# Patient Record
Sex: Male | Born: 1970 | Race: White | Hispanic: No | Marital: Married | Smoking: Former smoker
Health system: Southern US, Community
[De-identification: ages and names within clinical notes are randomized; demographics above are authoritative.]

## PROBLEM LIST (undated history)

## (undated) DIAGNOSIS — F32A Depression, unspecified: Secondary | ICD-10-CM

## (undated) DIAGNOSIS — T7840XA Allergy, unspecified, initial encounter: Secondary | ICD-10-CM

## (undated) DIAGNOSIS — I10 Essential (primary) hypertension: Secondary | ICD-10-CM

## (undated) DIAGNOSIS — K219 Gastro-esophageal reflux disease without esophagitis: Secondary | ICD-10-CM

## (undated) DIAGNOSIS — E079 Disorder of thyroid, unspecified: Secondary | ICD-10-CM

## (undated) DIAGNOSIS — G4730 Sleep apnea, unspecified: Secondary | ICD-10-CM

## (undated) DIAGNOSIS — A0472 Enterocolitis due to Clostridium difficile, not specified as recurrent: Secondary | ICD-10-CM

## (undated) DIAGNOSIS — F419 Anxiety disorder, unspecified: Secondary | ICD-10-CM

## (undated) DIAGNOSIS — F329 Major depressive disorder, single episode, unspecified: Secondary | ICD-10-CM

## (undated) HISTORY — DX: Enterocolitis due to Clostridium difficile, not specified as recurrent: A04.72

## (undated) HISTORY — DX: Depression, unspecified: F32.A

## (undated) HISTORY — DX: Sleep apnea, unspecified: G47.30

## (undated) HISTORY — DX: Major depressive disorder, single episode, unspecified: F32.9

## (undated) HISTORY — DX: Allergy, unspecified, initial encounter: T78.40XA

## (undated) HISTORY — DX: Anxiety disorder, unspecified: F41.9

## (undated) HISTORY — DX: Gastro-esophageal reflux disease without esophagitis: K21.9

## (undated) HISTORY — PX: COLONOSCOPY: SHX174

## (undated) HISTORY — PX: SIGMOIDOSCOPY: SUR1295

## (undated) HISTORY — PX: WISDOM TOOTH EXTRACTION: SHX21

---

## 2004-10-17 ENCOUNTER — Ambulatory Visit (HOSPITAL_COMMUNITY): Admission: RE | Admit: 2004-10-17 | Discharge: 2004-10-17 | Payer: Self-pay | Admitting: Family Medicine

## 2004-11-19 ENCOUNTER — Ambulatory Visit: Payer: Self-pay | Admitting: Gastroenterology

## 2004-12-03 ENCOUNTER — Encounter (INDEPENDENT_AMBULATORY_CARE_PROVIDER_SITE_OTHER): Payer: Self-pay | Admitting: Specialist

## 2004-12-03 ENCOUNTER — Ambulatory Visit: Payer: Self-pay | Admitting: Gastroenterology

## 2004-12-10 ENCOUNTER — Ambulatory Visit (HOSPITAL_COMMUNITY): Admission: RE | Admit: 2004-12-10 | Discharge: 2004-12-10 | Payer: Self-pay | Admitting: Gastroenterology

## 2004-12-17 ENCOUNTER — Ambulatory Visit: Payer: Self-pay | Admitting: Gastroenterology

## 2006-04-26 ENCOUNTER — Emergency Department (HOSPITAL_COMMUNITY): Admission: EM | Admit: 2006-04-26 | Discharge: 2006-04-26 | Payer: Self-pay | Admitting: Emergency Medicine

## 2006-05-03 ENCOUNTER — Ambulatory Visit (HOSPITAL_COMMUNITY): Admission: RE | Admit: 2006-05-03 | Discharge: 2006-05-03 | Payer: Self-pay | Admitting: Emergency Medicine

## 2006-07-14 ENCOUNTER — Ambulatory Visit: Payer: Self-pay | Admitting: Gastroenterology

## 2007-12-10 ENCOUNTER — Emergency Department (HOSPITAL_COMMUNITY): Admission: EM | Admit: 2007-12-10 | Discharge: 2007-12-11 | Payer: Self-pay | Admitting: Emergency Medicine

## 2008-05-23 ENCOUNTER — Emergency Department (HOSPITAL_COMMUNITY): Admission: EM | Admit: 2008-05-23 | Discharge: 2008-05-23 | Payer: Self-pay | Admitting: Emergency Medicine

## 2008-07-12 ENCOUNTER — Ambulatory Visit (HOSPITAL_COMMUNITY): Admission: RE | Admit: 2008-07-12 | Discharge: 2008-07-12 | Payer: Self-pay | Admitting: Sports Medicine

## 2010-07-27 ENCOUNTER — Encounter: Payer: Self-pay | Admitting: Gastroenterology

## 2010-07-27 ENCOUNTER — Encounter: Payer: Self-pay | Admitting: Family Medicine

## 2010-11-21 NOTE — Assessment & Plan Note (Signed)
[facility] OFFICE NOTE   NAME:CAMPBELLCejay, Hayden Bennett                      MRN:          [id number]  DATE:07/14/2006                            DOB:          02/27/1971    PROBLEM:  Abdominal pain.   Mr. Dunklee has returned once again complaining of abdominal pain. Pain  basically started in the right upper quadrant and radiates to the right  lower quadrant. Bowels are quite irregular, characterized by bloating,  constipation, and loose stools. There is no history of melena or  hematochezia. He apparently was seen in the ER several months ago for  pain. HIDA scan was normal. Blood work including; CBC, lipase, and  urinalysis. CBC and lipase were normal and urinalysis was unremarkable.  Weight has been stable. He is on no medications. Upper endoscopy for  somewhat similar complaints in May 2006 was pertinently only for mild  duodenitis. He claims his stools are varying in color from orange to  green to sometimes clay-colored.   On exam, pulse 60, blood pressure 92/74, weight 145.   PHYSICAL EXAMINATION:  HEENT: EOMI. PERRLA. Sclerae are anicteric.  Conjunctivae are pink.  NECK:  Supple without thyromegaly, adenopathy or carotid bruits.  CHEST:  Clear to auscultation and percussion without adventitious  sounds.  CARDIAC: Regular rhythm; normal S1 S2.  There are no murmurs, gallops or  rubs.  ABDOMEN:  Bowel sounds are normoactive.  Abdomen is soft, nontender and  nondistended.  There are no abdominal masses, tenderness, splenic  enlargement or hepatomegaly.  EXTREMITIES:  Full range of motion.  No cyanosis, clubbing or edema.  RECTAL:  Deferred   IMPRESSION:  Probable irritable bowel syndrome. Inflammatory bowel  disease is less likely.   RECOMMENDATION:  1. NuLev 0.25 mg sublingual every 4 hours as needed.  2. Colonoscopy.     Sandy Salaam. Deatra Ina, MD,FACG  Electronically Signed    RDK/MedQ  DD:  07/14/2006  DT: 07/14/2006  Job #: [id number]

## 2011-04-02 LAB — CBC
HCT: 38 — ABNORMAL LOW
Hemoglobin: 13.4
MCHC: 35.3
Platelets: 156
RDW: 12.2

## 2011-04-02 LAB — URINALYSIS, ROUTINE W REFLEX MICROSCOPIC
Bilirubin Urine: NEGATIVE
Ketones, ur: 40 — AB
Nitrite: NEGATIVE
Protein, ur: NEGATIVE
pH: 6

## 2011-04-02 LAB — POCT I-STAT, CHEM 8
Calcium, Ion: 1.06 — ABNORMAL LOW
HCT: 40
Hemoglobin: 13.6
Sodium: 129 — ABNORMAL LOW
TCO2: 27

## 2011-04-02 LAB — DIFFERENTIAL
Basophils Absolute: 0.1
Basophils Relative: 1
Eosinophils Relative: 0
Monocytes Absolute: 0.3
Neutro Abs: 6.4

## 2011-04-02 LAB — POCT CARDIAC MARKERS
Myoglobin, poc: 55.2
Operator id: 264421

## 2011-04-07 LAB — CBC
HCT: 39.9
Hemoglobin: 13.9
MCHC: 34.8
MCV: 96.6
RDW: 13.1

## 2011-04-07 LAB — URINALYSIS, ROUTINE W REFLEX MICROSCOPIC
Bilirubin Urine: NEGATIVE
Glucose, UA: NEGATIVE
Hgb urine dipstick: NEGATIVE
Ketones, ur: NEGATIVE
pH: 8

## 2011-04-07 LAB — DIFFERENTIAL
Basophils Absolute: 0
Basophils Relative: 1
Lymphocytes Relative: 26
Monocytes Relative: 9
Neutro Abs: 3
Neutrophils Relative %: 64

## 2011-04-07 LAB — COMPREHENSIVE METABOLIC PANEL
Alkaline Phosphatase: 62
BUN: 11
Creatinine, Ser: 0.7
Glucose, Bld: 100 — ABNORMAL HIGH
Potassium: 4
Total Protein: 7

## 2011-11-14 ENCOUNTER — Ambulatory Visit: Payer: Self-pay | Admitting: Physician Assistant

## 2011-11-14 VITALS — BP 144/89 | HR 73 | Temp 97.9°F | Resp 16 | Ht 74.0 in | Wt 210.6 lb

## 2011-11-14 DIAGNOSIS — W57XXXA Bitten or stung by nonvenomous insect and other nonvenomous arthropods, initial encounter: Secondary | ICD-10-CM

## 2011-11-14 DIAGNOSIS — R21 Rash and other nonspecific skin eruption: Secondary | ICD-10-CM

## 2011-11-14 NOTE — Progress Notes (Signed)
  Subjective:    Patient ID: ERIBERTO FELCH, male    DOB: Mar 07, 1971, 41 y.o.   MRN: [id number]  HPI Mr. Prabhakar comes in today c/o ? Insect bite on back of right upper leg.  Noticed a red itchy bump on leg 6 days ago.  Initially itchy with minimal pain.  Those symptoms have since resolved but color has changed and area is slightly larger.  No drainage at any point.  Denies fever or chills, myalgias, arthralgias, HA, other rash.  Pt did not see an insect bite him.  He had RMSF 10 years ago and is concerned today.    Review of Systems As above    Objective:   Physical Exam  Constitutional: He appears well-developed and well-nourished.  Skin:          Right medial posterior thigh with half dollar sized reddish purple lesion with central scab.  No induration or fluctuation.  Superior aspect with several petechial lesions. No streaking or tenderness.    Photo taken of lesion      Assessment & Plan:  Insect bite, right thigh  Reviewed RMSF signs and symptoms.  I do not feel that this is a tick bite but offered Doxycycline.  Pt would like to hold and watch symptoms closely.  Wound care reviewed.

## 2016-10-02 ENCOUNTER — Emergency Department (HOSPITAL_COMMUNITY): Payer: Self-pay

## 2016-10-02 ENCOUNTER — Encounter (HOSPITAL_COMMUNITY): Payer: Self-pay | Admitting: Emergency Medicine

## 2016-10-02 ENCOUNTER — Emergency Department (HOSPITAL_COMMUNITY)
Admission: EM | Admit: 2016-10-02 | Discharge: 2016-10-02 | Disposition: A | Discharge status: Home / Self Care | Payer: Self-pay | Attending: Emergency Medicine | Admitting: Emergency Medicine

## 2016-10-02 DIAGNOSIS — K921 Melena: Secondary | ICD-10-CM

## 2016-10-02 DIAGNOSIS — R195 Other fecal abnormalities: Secondary | ICD-10-CM | POA: Insufficient documentation

## 2016-10-02 DIAGNOSIS — I10 Essential (primary) hypertension: Secondary | ICD-10-CM | POA: Insufficient documentation

## 2016-10-02 DIAGNOSIS — K6389 Other specified diseases of intestine: Secondary | ICD-10-CM | POA: Insufficient documentation

## 2016-10-02 DIAGNOSIS — R197 Diarrhea, unspecified: Secondary | ICD-10-CM | POA: Insufficient documentation

## 2016-10-02 DIAGNOSIS — Z87891 Personal history of nicotine dependence: Secondary | ICD-10-CM | POA: Insufficient documentation

## 2016-10-02 HISTORY — DX: Essential (primary) hypertension: I10

## 2016-10-02 HISTORY — DX: Disorder of thyroid, unspecified: E07.9

## 2016-10-02 LAB — COMPREHENSIVE METABOLIC PANEL
ALBUMIN: 3.9 g/dL (ref 3.5–5.0)
ALK PHOS: 82 U/L (ref 38–126)
ALT: 23 U/L (ref 17–63)
ANION GAP: 9 (ref 5–15)
AST: 22 U/L (ref 15–41)
BUN: 10 mg/dL (ref 6–20)
CO2: 26 mmol/L (ref 22–32)
Calcium: 9 mg/dL (ref 8.9–10.3)
Chloride: 103 mmol/L (ref 101–111)
Creatinine, Ser: 0.93 mg/dL (ref 0.61–1.24)
GFR calc Af Amer: >60 mL/min (ref >60)
GFR calc non Af Amer: >60 mL/min (ref >60)
GLUCOSE: 102 mg/dL — AB (ref 65–99)
POTASSIUM: 3.5 mmol/L (ref 3.5–5.1)
SODIUM: 138 mmol/L (ref 135–145)
Total Bilirubin: 0.8 mg/dL (ref 0.3–1.2)
Total Protein: 8 g/dL (ref 6.5–8.1)

## 2016-10-02 LAB — CBC
HCT: 39.4 % (ref 39.0–52.0)
HEMOGLOBIN: 13.8 g/dL (ref 13.0–17.0)
MCH: 32.3 pg (ref 26.0–34.0)
MCHC: 35 g/dL (ref 30.0–36.0)
MCV: 92.3 fL (ref 78.0–100.0)
Platelets: 368 10*3/uL (ref 150–400)
RBC: 4.27 MIL/uL (ref 4.22–5.81)
RDW: 12.1 % (ref 11.5–15.5)
WBC: 10.6 10*3/uL — AB (ref 4.0–10.5)

## 2016-10-02 MED ORDER — BENZONATATE 100 MG PO CAPS: 100.0000 mg | ORAL_CAPSULE | Freq: Three times a day (TID) | ORAL | 0 refills | Status: DC

## 2016-10-02 MED ORDER — MORPHINE SULFATE (PF) 4 MG/ML IV SOLN: 4.0000 mg | INTRAVENOUS | Status: DC | PRN

## 2016-10-02 MED ORDER — SODIUM CHLORIDE 0.9 % IV SOLN
1000.0000 mL | Freq: Once | INTRAVENOUS | Status: AC
  Administered 2016-10-02: 1000 mL via INTRAVENOUS

## 2016-10-02 MED ORDER — ALBUTEROL SULFATE HFA 108 (90 BASE) MCG/ACT IN AERS
2.0000 | INHALATION_SPRAY | Freq: Once | RESPIRATORY_TRACT | Status: AC
  Administered 2016-10-02: 2 via RESPIRATORY_TRACT
  Filled 2016-10-02: qty 6.7

## 2016-10-02 MED ORDER — IOPAMIDOL (ISOVUE-300) INJECTION 61%
INTRAVENOUS | Status: AC
  Filled 2016-10-02: qty 30

## 2016-10-02 MED ORDER — OXYCODONE-ACETAMINOPHEN 5-325 MG PO TABS: 1.0000 | ORAL_TABLET | ORAL | 0 refills | Status: DC | PRN

## 2016-10-02 MED ORDER — SODIUM CHLORIDE 0.9 % IV SOLN
1000.0000 mL | INTRAVENOUS | Status: DC
  Administered 2016-10-02: 1000 mL via INTRAVENOUS

## 2016-10-02 MED ORDER — IOPAMIDOL (ISOVUE-300) INJECTION 61%
INTRAVENOUS | Status: AC
  Administered 2016-10-02: 100 mL
  Filled 2016-10-02: qty 100

## 2016-10-02 MED ORDER — IOPAMIDOL (ISOVUE-300) INJECTION 61%
30.0000 mL | Freq: Once | INTRAVENOUS | Status: AC | PRN
  Administered 2016-10-02: 30 mL via ORAL

## 2016-10-02 NOTE — ED Provider Notes (Signed)
[facility] DEPT Provider Note   CSN: [id number] Arrival date & time: 10/02/16  0920     History   Chief Complaint Chief Complaint  Patient presents with  . Rectal Pain    HPI Hayden Bennett is a 46 y.o. male.  HPI Patient presents emergency department with 2 months of diarrhea and now several weeks of blood noted in his stool with painful defecation.  He's also had URI symptoms over the past 2 months with ongoing productive cough.  No chest pain or shortness of breath.  He has never had a colonoscopy.  No family history of early colon cancer.  Sometimes he will have blood in the toilet or blood on the tissue.  No prior history of hemorrhoids.  No fevers or chills.  No history of tobacco abuse.   Past Medical History:  Diagnosis Date  . Hypertension   . Thyroid disease     There are no active problems to display for this patient.   Past Surgical History:  Procedure Laterality Date  . WISDOM TOOTH EXTRACTION         Home Medications    Prior to Admission medications   Medication Sig Start Date End Date Taking? Authorizing Provider  losartan-hydrochlorothiazide (HYZAAR) 100-12.5 MG tablet Take 1 tablet by mouth daily.   Yes Historical Provider, MD  NP THYROID 90 MG tablet Take 90 mg by mouth daily. 07/13/16  Yes Historical Provider, MD  oxyCODONE-acetaminophen (PERCOCET/ROXICET) 5-325 MG tablet Take 1 tablet by mouth every 4 (four) hours as needed for severe pain. 10/02/16   Jola Schmidt, MD    Family History No family history on file.  Social History Social History  Substance Use Topics  . Smoking status: Former Smoker    Quit date: 11/13/1997  . Smokeless tobacco: Never Used  . Alcohol use No     Comment: none since Jan 2018     Allergies   Ciprofloxacin   Review of Systems Review of Systems  All other systems reviewed and are negative.    Physical Exam Updated Vital Signs BP 112/69   Pulse 91   Temp 97.8 F (36.6 C) (Oral)   Resp 14    SpO2 94%   Physical Exam  Constitutional: He is oriented to person, place, and time. He appears well-developed and well-nourished.  HENT:  Head: Normocephalic and atraumatic.  Eyes: EOM are normal.  Neck: Normal range of motion.  Cardiovascular: Normal rate, regular rhythm, normal heart sounds and intact distal pulses.   Pulmonary/Chest: Effort normal and breath sounds normal. No respiratory distress.  Abdominal: Soft. He exhibits no distension. There is no tenderness.  Genitourinary:  Genitourinary Comments: Pain elicited with rectal examination.  No gross blood.  No obvious masses or hemorrhoids palpable.  No external hemorrhoids visible.  Musculoskeletal: Normal range of motion.  Neurological: He is alert and oriented to person, place, and time.  Skin: Skin is warm and dry.  Psychiatric: He has a normal mood and affect. Judgment normal.  Nursing note and vitals reviewed.    ED Treatments / Results  Labs (all labs ordered are listed, but only abnormal results are displayed) Labs Reviewed  CBC - Abnormal; Notable for the following:       Result Value   WBC 10.6 (*)    All other components within normal limits  COMPREHENSIVE METABOLIC PANEL - Abnormal; Notable for the following:    Glucose, Bld 102 (*)    All other components within normal limits  EKG  EKG Interpretation None       Radiology Dg Chest 2 View  Result Date: 10/02/2016 CLINICAL DATA:  Cough. EXAM: CHEST  2 VIEW COMPARISON:  Radiographs of December 10, 2007. FINDINGS: The heart size and mediastinal contours are within normal limits. Both lungs are clear. No pneumothorax or pleural effusion is noted. The visualized skeletal structures are unremarkable. IMPRESSION: No active cardiopulmonary disease. Electronically Signed   By: Marijo Conception, M.D.   On: 10/02/2016 10:42   Ct Abdomen Pelvis W Contrast  Result Date: 10/02/2016 CLINICAL DATA:  Rectal pain for 2 weeks. EXAM: CT ABDOMEN AND PELVIS WITH CONTRAST  TECHNIQUE: Multidetector CT imaging of the abdomen and pelvis was performed using the standard protocol following bolus administration of intravenous contrast. CONTRAST:  160m ISOVUE-300 IOPAMIDOL (ISOVUE-300) INJECTION 61%, 337mISOVUE-300 IOPAMIDOL (ISOVUE-300) INJECTION 61% COMPARISON:  CT scan of April 02, 2008. FINDINGS: Lower chest: No acute abnormality. Hepatobiliary: No focal liver abnormality is seen. No gallstones, gallbladder wall thickening, or biliary dilatation. Pancreas: Unremarkable. No pancreatic ductal dilatation or surrounding inflammatory changes. Spleen: Normal in size without focal abnormality. Adrenals/Urinary Tract: Adrenal glands are unremarkable. Kidneys are normal, without renal calculi, focal lesion, or hydronephrosis. Bladder is unremarkable. Stomach/Bowel: No definite bowel obstruction is noted. The appendix appears normal. However, there is the suggestion of a lobulated density seen within the right colon concerning for possible mass. Vascular/Lymphatic: No significant vascular findings are present. No enlarged abdominal or pelvic lymph nodes. Reproductive: Prostate is unremarkable. Other: No abdominal wall hernia or abnormality. No abdominopelvic ascites. Musculoskeletal: No acute or significant osseous findings. IMPRESSION: Lobulated density seen within the right colon concerning for possible mass or neoplasm. Colonoscopy is recommended for further evaluation. No other abnormality seen in the abdomen or pelvis. Electronically Signed   By: JaMarijo ConceptionM.D.   On: 10/02/2016 12:02    Procedures Procedures (including critical care time)  Medications Ordered in ED Medications  0.9 %  sodium chloride infusion (0 mLs Intravenous Stopped 10/02/16 1153)    Followed by  0.9 %  sodium chloride infusion (1,000 mLs Intravenous New Bag/Given 10/02/16 1017)  morphine 4 MG/ML injection 4 mg (not administered)  iopamidol (ISOVUE-300) 61 % injection (not administered)  albuterol  (PROVENTIL HFA;VENTOLIN HFA) 108 (90 Base) MCG/ACT inhaler 2 puff (2 puffs Inhalation Given 10/02/16 0958)  iopamidol (ISOVUE-300) 61 % injection 30 mL (30 mLs Oral Contrast Given 10/02/16 1010)  iopamidol (ISOVUE-300) 61 % injection (100 mLs  Contrast Given 10/02/16 1127)     Initial Impression / Assessment and Plan / ED Course  I have reviewed the triage vital signs and the nursing notes.  Pertinent labs & imaging results that were available during my care of the patient were reviewed by me and considered in my medical decision making (see chart for details).     CT scan concerning for right-sided colonic mass which will need further evaluation by direct visualization and colonoscopy.  In regards to his pain with defecation no clear etiology is found.  Suspect possible internal hemorrhoids.  Patient be treated with a short course pain medication.  He's been given referral numbers for GI.  He understands that this could represent colon cancer.  He understands importance of close follow-up, direct visualization with colonoscopy and possible need for biopsy.  He understands to return the emergency department for new or worsening symptoms  [facility].  No shortness of  breath.  Home with bronchodilators and Tessalon Perles  Final Clinical Impressions(s) / ED Diagnoses   Final diagnoses:  Diarrhea, unspecified type  Blood in stool  Colonic mass    New Prescriptions New Prescriptions   OXYCODONE-ACETAMINOPHEN (PERCOCET/ROXICET) 5-325 MG TABLET    Take 1 tablet by mouth every 4 (four) hours as needed for severe pain.     Jola Schmidt, MD 10/02/16 [phone]

## 2016-10-02 NOTE — ED Notes (Signed)
Patient transported to CT 

## 2016-10-02 NOTE — ED Notes (Signed)
ED Provider at bedside. 

## 2016-10-02 NOTE — ED Triage Notes (Signed)
Patient c/o rectal pain and bloody stools that have been going on for couple weeks. Patient reports URI and diarrhea since Jan this year.  Patient states has rectal pain all ways then pain increases when having BM.  Patient states that blood in stools is intermittent and sometimes it is bright red, sometimes patient states that water in toilet is red, sometimes blood will just be on paper when wiping and other times will have blood that will drip from rectum after BM.

## 2016-10-05 ENCOUNTER — Telehealth: Payer: Self-pay | Admitting: Internal Medicine

## 2016-10-05 NOTE — Telephone Encounter (Signed)
Looks ok to wait until next week to be seen in my opinion If he worsens he should call us back  Sitz baths may help rectal pain

## 2016-10-05 NOTE — Telephone Encounter (Signed)
Patient has been scheduled for a office visit with APP for next week.  Ok to wait? See CT report from ED.  Former Financial planner patient

## 2016-10-05 NOTE — Telephone Encounter (Signed)
Sent to attention of DOD Dr.Gessner. Please advise as to scheduling.

## 2016-10-05 NOTE — Telephone Encounter (Signed)
Patient notified that we will call if there are any openings earlier.

## 2016-10-06 ENCOUNTER — Ambulatory Visit (INDEPENDENT_AMBULATORY_CARE_PROVIDER_SITE_OTHER): Payer: Self-pay | Admitting: Gastroenterology

## 2016-10-06 ENCOUNTER — Encounter: Payer: Self-pay | Admitting: Gastroenterology

## 2016-10-06 ENCOUNTER — Other Ambulatory Visit (INDEPENDENT_AMBULATORY_CARE_PROVIDER_SITE_OTHER): Payer: Self-pay

## 2016-10-06 VITALS — BP 96/56 | HR 92 | Resp 18 | Ht 74.0 in | Wt 198.0 lb

## 2016-10-06 DIAGNOSIS — R197 Diarrhea, unspecified: Secondary | ICD-10-CM

## 2016-10-06 DIAGNOSIS — K6289 Other specified diseases of anus and rectum: Secondary | ICD-10-CM

## 2016-10-06 DIAGNOSIS — R938 Abnormal findings on diagnostic imaging of other specified body structures: Secondary | ICD-10-CM

## 2016-10-06 DIAGNOSIS — R9389 Abnormal findings on diagnostic imaging of other specified body structures: Secondary | ICD-10-CM

## 2016-10-06 LAB — CBC WITH DIFFERENTIAL/PLATELET
BASOS ABS: 0.1 10*3/uL (ref 0.0–0.1)
Basophils Relative: 0.7 % (ref 0.0–3.0)
EOS ABS: 0.1 10*3/uL (ref 0.0–0.7)
Eosinophils Relative: 0.5 % (ref 0.0–5.0)
HCT: 38.9 % — ABNORMAL LOW (ref 39.0–52.0)
HEMOGLOBIN: 13.5 g/dL (ref 13.0–17.0)
Lymphocytes Relative: 6.8 % — ABNORMAL LOW (ref 12.0–46.0)
Lymphs Abs: 1 10*3/uL (ref 0.7–4.0)
MCHC: 34.7 g/dL (ref 30.0–36.0)
MCV: 91.2 fl (ref 78.0–100.0)
MONO ABS: 1.1 10*3/uL — AB (ref 0.1–1.0)
Monocytes Relative: 7.7 % (ref 3.0–12.0)
Neutro Abs: 12.1 10*3/uL — ABNORMAL HIGH (ref 1.4–7.7)
Neutrophils Relative %: 84.3 % — ABNORMAL HIGH (ref 43.0–77.0)
Platelets: 452 10*3/uL — ABNORMAL HIGH (ref 150.0–400.0)
RBC: 4.27 Mil/uL (ref 4.22–5.81)
RDW: 12.2 % (ref 11.5–15.5)
WBC: 14.4 10*3/uL — AB (ref 4.0–10.5)

## 2016-10-06 MED ORDER — NA SULFATE-K SULFATE-MG SULF 17.5-3.13-1.6 GM/177ML PO SOLN: 1.0000 | Freq: Once | ORAL | 0 refills | Status: AC

## 2016-10-06 MED ORDER — AMOXICILLIN-POT CLAVULANATE 875-125 MG PO TABS: 1.0000 | ORAL_TABLET | Freq: Two times a day (BID) | ORAL | 0 refills | Status: DC

## 2016-10-06 MED ORDER — OXYCODONE-ACETAMINOPHEN 5-325 MG PO TABS: 1.0000 | ORAL_TABLET | Freq: Four times a day (QID) | ORAL | 0 refills | Status: DC | PRN

## 2016-10-06 NOTE — Progress Notes (Signed)
HPI :  46 y/o male with a history of HTN and hypothyroidism, presenting for a new patient visit for rectal discomfort, changes in bowel habits, and abnormal CT scan.   He has severe pain in his anal canal / rectum. He reports pain is worst with a bowel movement and stays painful for an hour after to a severe degree before decreasing in severity but does have some baseline discomfort all the time since it's started. He's also had diarrhea starting in January, and then rectal bleeding which started about 3 weeks ago. Pain ongoing for a few weeks but persistently about 2 weeks at this point. He was in the ER at [facility] last Friday. He had a CT of the abdomen / pelvis done which showed a ? mass in the right colon, otherwise no pathology in the pelvis noted. He feels like something is "stuck" in his rectum causing pain. He has a sense of incomplete evacuation.  He was given some oxycodone for the pain by the ER, which helps with some of the discomfort. He does endorse a history of anal fissure in the past. He has lost about 10 lbs in the past few weeks. He is having about 2 bowel movements per day, trying to stay on bland diet to minimize bowel movements. He has tried [employer].   No FH of CRC or Crohn's. He denies NSAID use.    Past Medical History:  Diagnosis Date  . Hypertension   . Thyroid disease      Past Surgical History:  Procedure Laterality Date  . WISDOM TOOTH EXTRACTION     Family History  Problem Relation Age of Onset  . Hypertension Mother   . Thyroid disease Mother   . Hypertension Father    Social History  Substance Use Topics  . Smoking status: Former Smoker    Quit date: 11/13/1997  . Smokeless tobacco: Never Used  . Alcohol use No     Comment: none since Jan 2018   Current Outpatient Prescriptions  Medication Sig Dispense Refill  . benzonatate (TESSALON) 100 MG capsule Take 1 capsule (100 mg total) by mouth every 8 (eight) hours. 21 capsule 0  .  losartan-hydrochlorothiazide (HYZAAR) 100-12.5 MG tablet Take 1 tablet by mouth daily.    . NP THYROID 90 MG tablet Take 90 mg by mouth daily.  2   No current facility-administered medications for this visit.    Allergies  Allergen Reactions  . Ciprofloxacin Swelling    Swelling of lower leg muscles     Review of Systems: All systems reviewed and negative except where noted in HPI.    Dg Chest 2 View  Result Date: 10/02/2016 CLINICAL DATA:  Cough. EXAM: CHEST  2 VIEW COMPARISON:  Radiographs of December 10, 2007. FINDINGS: The heart size and mediastinal contours are within normal limits. Both lungs are clear. No pneumothorax or pleural effusion is noted. The visualized skeletal structures are unremarkable. IMPRESSION: No active cardiopulmonary disease. Electronically Signed   By: Marijo Conception, M.D.   On: 10/02/2016 10:42   Ct Abdomen Pelvis W Contrast  Result Date: 10/02/2016 CLINICAL DATA:  Rectal pain for 2 weeks. EXAM: CT ABDOMEN AND PELVIS WITH CONTRAST TECHNIQUE: Multidetector CT imaging of the abdomen and pelvis was performed using the standard protocol following bolus administration of intravenous contrast. CONTRAST:  112m ISOVUE-300 IOPAMIDOL (ISOVUE-300) INJECTION 61%, 369mISOVUE-300 IOPAMIDOL (ISOVUE-300) INJECTION 61% COMPARISON:  CT scan of April 02, 2008. FINDINGS: Lower chest: No acute abnormality.  Hepatobiliary: No focal liver abnormality is seen. No gallstones, gallbladder wall thickening, or biliary dilatation. Pancreas: Unremarkable. No pancreatic ductal dilatation or surrounding inflammatory changes. Spleen: Normal in size without focal abnormality. Adrenals/Urinary Tract: Adrenal glands are unremarkable. Kidneys are normal, without renal calculi, focal lesion, or hydronephrosis. Bladder is unremarkable. Stomach/Bowel: No definite bowel obstruction is noted. The appendix appears normal. However, there is the suggestion of a lobulated density seen within the right colon  concerning for possible mass. Vascular/Lymphatic: No significant vascular findings are present. No enlarged abdominal or pelvic lymph nodes. Reproductive: Prostate is unremarkable. Other: No abdominal wall hernia or abnormality. No abdominopelvic ascites. Musculoskeletal: No acute or significant osseous findings. IMPRESSION: Lobulated density seen within the right colon concerning for possible mass or neoplasm. Colonoscopy is recommended for further evaluation. No other abnormality seen in the abdomen or pelvis. Electronically Signed   By: Marijo Conception, M.D.   On: 10/02/2016 12:02    Lab Results  Component Value Date   WBC 10.6 (H) 10/02/2016   HGB 13.8 10/02/2016   HCT 39.4 10/02/2016   MCV 92.3 10/02/2016   PLT 368 10/02/2016     Physical Exam: BP (!) 96/56   Pulse 92   Resp 18   Ht 6' 2"  (1.88 m)   Wt 198 lb (89.8 kg)   BMI 25.42 kg/m  Constitutional: Pleasant, male HEENT: Normocephalic and atraumatic. Conjunctivae are normal. No scleral icterus. Neck supple.  Cardiovascular: Normal rate, regular rhythm.  Pulmonary/chest: Effort normal and breath sounds normal. No wheezing, rales or rhonchi. Abdominal: Soft, nondistended, nontender.There are no masses palpable. No hepatomegaly DRE / Anoscopy - some mild diffuse tenderness around perianal area but no focal palpable fluctuance or erythema, no fistula, no fissure appreciated. Pain with placement of examination finger and anoscopy. No mass. Anoscopy shows inflamed internal hemorrhoids with some purulent discharge at right posterior hemorrhoid, but could not visualize origin  Extremities: no edema Lymphadenopathy: No cervical adenopathy noted. Neurological: Alert and oriented to person place and time. Skin: Skin is warm and dry. No rashes noted. Psychiatric: Normal mood and affect. Behavior is normal.   ASSESSMENT AND PLAN: 46 year old male with medical history as outlined above presenting for the following issues:  Rectal pain  - previously had CT scan abdomen / pelvis done 4 days ago for this symptom which has persisted. I called Dr. Nyoka Cowden of radiology and reviewed the scan with him, specifically evaluating for any evidence of perirectal abscess, which he states he sees no evidence of. DRE today shows no fissure, fistula, or overt abscess on external exam, but given pain and purulent material noted on anoscopy along inflamed hemorrhoids, an abscess remains possible. I'm going to repeat a CBC today and start him empirically on antibiotics, we'll give him Augmentin 833m BID given his Cipro allergy. I will refill a short course of Percocet to take as needed due to severe pain. If he is not improving in the next 24-48 hours, or worsening in the interim, I instructed him to call me and we will repeat dedicated imaging of the pelvis with either CT or MRI to ensure no evidence of abscess. Otherwise I have tentatively scheduled for colonoscopy next week, rule out proctitis, I don't think he can tolerate prep at this time, hopefully able to once pain improved. If not will perform flex sig  Abnormal CT scan - there is concern for a possible right colon mass on CT. He warrants a colonoscopy to further evaluate discussed this with him. Following  discussion of risks and benefits he wanted to proceed. Hopefully his rectal discomfort is improved with therapy as above and he can tolerate a bowel prep next week. Further recommendations pending the result  Diarrhea - mild at this time, no evidence of colitis noted on CT. We'll send stool for infectious workup given his symptoms but otherwise await colonoscopy next week. Counseled him his diarrhea may worsen on Augmentin   Cellar, MD [facility] Pager [phone]

## 2016-10-06 NOTE — Patient Instructions (Addendum)
If you are age 47 or older, your body mass index should be between 23-30. Your Body mass index is 25.42 kg/m. If this is out of the aforementioned range listed, please consider follow up with your Primary Care Provider.  If you are age 72 or younger, your body mass index should be between 19-25. Your Body mass index is 25.42 kg/m. If this is out of the aformentioned range listed, please consider follow up with your Primary Care Provider.   Your physician has requested that you go to the basement for the following lab work before leaving today:  CBC, GI Pathogen panel  We have sent the following medications to your pharmacy for you to pick up at your convenience:  Percocet  Augmentin  Suprep  You have been scheduled for a colonoscopy. Please follow written instructions given to you at your visit today.  Please pick up your prep supplies at the pharmacy within the next 1-3 days. If you use inhalers (even only as needed), please bring them with you on the day of your procedure. Your physician has requested that you go to www.startemmi.com and enter the access code given to you at your visit today. This web site gives a general overview about your procedure. However, you should still follow specific instructions given to you by our office regarding your preparation for the procedure.  Please call the office back if you are not better or symptoms become worse.  Thank you.

## 2016-10-07 ENCOUNTER — Other Ambulatory Visit: Payer: Self-pay

## 2016-10-07 ENCOUNTER — Ambulatory Visit (HOSPITAL_COMMUNITY)
Admission: RE | Admit: 2016-10-07 | Discharge: 2016-10-07 | Disposition: A | Discharge status: Home / Self Care | Payer: Self-pay | Source: Ambulatory Visit | Attending: Gastroenterology | Admitting: Gastroenterology

## 2016-10-07 DIAGNOSIS — D72829 Elevated white blood cell count, unspecified: Secondary | ICD-10-CM

## 2016-10-07 DIAGNOSIS — K6289 Other specified diseases of anus and rectum: Secondary | ICD-10-CM

## 2016-10-07 MED ORDER — IOPAMIDOL (ISOVUE-300) INJECTION 61%: 75.0000 mL | Freq: Once | INTRAVENOUS | Status: DC | PRN

## 2016-10-08 ENCOUNTER — Other Ambulatory Visit: Payer: Self-pay

## 2016-10-08 DIAGNOSIS — R9389 Abnormal findings on diagnostic imaging of other specified body structures: Secondary | ICD-10-CM

## 2016-10-08 DIAGNOSIS — R197 Diarrhea, unspecified: Secondary | ICD-10-CM

## 2016-10-08 DIAGNOSIS — K6289 Other specified diseases of anus and rectum: Secondary | ICD-10-CM

## 2016-10-08 MED ORDER — LIDOCAINE HCL 2 % EX GEL: 1.0000 "application " | CUTANEOUS | 1 refills | Status: DC | PRN

## 2016-10-09 ENCOUNTER — Telehealth: Payer: Self-pay | Admitting: Gastroenterology

## 2016-10-09 LAB — GASTROINTESTINAL PATHOGEN PANEL PCR
C. difficile Tox A/B, PCR: DETECTED — CR
Campylobacter, PCR: NOT DETECTED
Cryptosporidium, PCR: NOT DETECTED
E COLI (ETEC) LT/ST, PCR: NOT DETECTED
E COLI (STEC) STX1/STX2, PCR: NOT DETECTED
E COLI 0157, PCR: NOT DETECTED
Giardia lamblia, PCR: NOT DETECTED
NOROVIRUS, PCR: NOT DETECTED
Rotavirus A, PCR: NOT DETECTED
SALMONELLA, PCR: NOT DETECTED
SHIGELLA, PCR: NOT DETECTED

## 2016-10-09 MED ORDER — METRONIDAZOLE 500 MG PO TABS: 500.0000 mg | ORAL_TABLET | Freq: Three times a day (TID) | ORAL | 0 refills | Status: AC

## 2016-10-09 NOTE — Telephone Encounter (Signed)
Patient called back Flagyl was much cheaper Sent Rx for 553m TID X14 days

## 2016-10-09 NOTE — Telephone Encounter (Signed)
Oncall Note Called patient informed Positive C.diff. He wanted to check the pharmacy that offered the cheapest price for Vancomycin or Flagyl and then decide as he currently has no insurnace. Advised him to call back once he has the information for the pharmacy that I need to send the Rx. Also advised him to stop Augmentin K. Denzil Magnuson , MD [phone] Mon-Fri 8a-5p [phone] after 5p, weekends, holidays

## 2016-10-12 ENCOUNTER — Other Ambulatory Visit: Payer: Self-pay

## 2016-10-13 ENCOUNTER — Ambulatory Visit: Payer: Self-pay | Admitting: Physician Assistant

## 2016-10-13 ENCOUNTER — Telehealth: Payer: Self-pay

## 2016-10-13 ENCOUNTER — Other Ambulatory Visit: Payer: Self-pay

## 2016-10-13 DIAGNOSIS — K6289 Other specified diseases of anus and rectum: Secondary | ICD-10-CM

## 2016-10-13 NOTE — Telephone Encounter (Signed)
Patient advised and scheduled for 4/24 at 10:00 [place] for colonoscopy. Mailed prep instructions. Patient did not start his antibiotics until 4/8, will finish them on 4/22. Asked that he call office by Friday, 4/20 if stools not back to normal. Patient states that he is having no more rectal pain, stools are now formed but soft.

## 2016-10-13 NOTE — Telephone Encounter (Signed)
Okay thanks [employer]

## 2016-10-15 ENCOUNTER — Encounter: Payer: Self-pay | Admitting: Gastroenterology

## 2016-10-27 ENCOUNTER — Ambulatory Visit (AMBULATORY_SURGERY_CENTER): Payer: Self-pay | Admitting: Gastroenterology

## 2016-10-27 ENCOUNTER — Encounter: Payer: Self-pay | Admitting: Gastroenterology

## 2016-10-27 VITALS — BP 119/87 | HR 79 | Temp 98.6°F | Resp 13 | Ht 74.0 in | Wt 198.0 lb

## 2016-10-27 DIAGNOSIS — K6289 Other specified diseases of anus and rectum: Secondary | ICD-10-CM

## 2016-10-27 DIAGNOSIS — K633 Ulcer of intestine: Secondary | ICD-10-CM

## 2016-10-27 DIAGNOSIS — R935 Abnormal findings on diagnostic imaging of other abdominal regions, including retroperitoneum: Secondary | ICD-10-CM

## 2016-10-27 MED ORDER — SODIUM CHLORIDE 0.9 % IV SOLN: 500.0000 mL | INTRAVENOUS | Status: DC

## 2016-10-27 NOTE — Progress Notes (Signed)
Called to room to assist during endoscopic procedure.  Patient ID and intended procedure confirmed with present staff. Received instructions for my participation in the procedure from the performing physician.  

## 2016-10-27 NOTE — Progress Notes (Signed)
To Pacu  Pt awae and alert report to RN

## 2016-10-27 NOTE — Patient Instructions (Signed)

## 2016-10-27 NOTE — Op Note (Addendum)
[place] Patient Name: Hayden Bennett Procedure Date: 10/27/2016 10:13 AM MRN: [id number] Endoscopist: Remo Lipps P. Neel Buffone MD, MD Age: 46 Referring MD:  Date of Birth: July 17, 1970 Gender: Male Account #: [id number] Procedure:                Colonoscopy Indications:              Abnormal CT of the GI tract (reported right colon                            mass), diarrhea, Rectal pain - patient tested                            positive for C diff and symptoms significantly                            improved / resolved with flagyl Medicines:                Monitored Anesthesia Care Procedure:                Pre-Anesthesia Assessment:                           - Prior to the procedure, a History and Physical                            was performed, and patient medications and                            allergies were reviewed. The patient's tolerance of                            previous anesthesia was also reviewed. The risks                            and benefits of the procedure and the sedation                            options and risks were discussed with the patient.                            All questions were answered, and informed consent                            was obtained. Prior Anticoagulants: The patient has                            taken no previous anticoagulant or antiplatelet                            agents. ASA Grade Assessment: II - A patient with                            mild systemic disease. After reviewing the risks  and benefits, the patient was deemed in                            satisfactory condition to undergo the procedure.                           After obtaining informed consent, the colonoscope                            was passed under direct vision. Throughout the                            procedure, the patient's blood pressure, pulse, and                            oxygen saturations were  monitored continuously. The                            Colonoscope was introduced through the anus and                            advanced to the the terminal ileum, with                            identification of the appendiceal orifice and IC                            valve. The colonoscopy was performed without                            difficulty. The patient tolerated the procedure                            well. The quality of the bowel preparation was                            good. The terminal ileum, ileocecal valve,                            appendiceal orifice, and rectum were photographed. Scope In: 10:18:41 AM Scope Out: 10:36:23 AM Total Procedure Duration: 0 hours 17 minutes 42 seconds  Findings:                 The perianal exam findings include very small anal                            fissure.                           The terminal ileum appeared normal.                           Multiple deep and medium to large sized ulcers were  found in the entire colon, worst in the hepatic                            flexure and right colon. No bleeding was present.                            Biopsies were taken with a cold forceps for                            histology.                           Internal hemorrhoids were found during                            retroflexion. A small ulcer was noted just proximal                            to the dentate line in the rectum which I suspect                            was causing the patient's rectal pain.                           The exam was otherwise without abnormality. No                            other inflammatory changes appreciated outside of                            the ulcerations, the colonic mucosa appeared                            normal. No mass lesion in the right colon                            appreciated on normal and retroflexed views. Complications:            No immediate  complications. Estimated blood loss:                            Minimal. Estimated Blood Loss:     Estimated blood loss was minimal. Impression:               - Small anal fissure found on perianal exam.                           - The examined portion of the ileum was normal.                           - Multiple ulcers in the entire examined colon.                            Biopsied.                           -  Internal hemorrhoids.                           - Small rectal ulcer, suspect causing the patient's                            prior symptoms.                           - The examination was otherwise normal.                           Overall, differential includes Crohn's disease                            versus infectious colitis versus other, will await                            pathology results.                           No colon mass appreciated, I suspect inflammatory                            changes appreciated on this exam caused the                            appearance of a mass on imaging. Recommendation:           - Patient has a contact number available for                            emergencies. The signs and symptoms of potential                            delayed complications were discussed with the                            patient. Return to normal activities tomorrow.                            Written discharge instructions were provided to the                            patient.                           - Resume previous diet.                           - Continue present medications.                           - No aspirin, ibuprofen, naproxen, or other                            non-steroidal anti-inflammatory drugs.                           -  Await pathology results.                           - Continue lidocaine jelly as needed for rectal                            pain if symptoms persist [name] P. Suede Greenawalt MD, MD 10/27/2016 10:45:41 AM This  report has been signed electronically.

## 2016-10-27 NOTE — Progress Notes (Signed)
Pt was dx c diff 10-09-16.  Amoxicillin d/c.  Began flagyl 500 mg tid for 2 weeks.  Completed flagyl 10-24-16.  Pt states my stools are firming up now. maw

## 2016-10-28 ENCOUNTER — Telehealth: Payer: Self-pay

## 2016-10-28 NOTE — Telephone Encounter (Signed)
  Follow up Call-  Call back number 10/27/2016  Post procedure Call Back phone  # [phone] cell  Permission to leave phone message Yes  Some recent data might be hidden     Patient questions:  Do you have a fever, pain , or abdominal swelling? No. Pain Score  0 *  Have you tolerated food without any problems? Yes.    Have you been able to return to your normal activities? Yes.    Do you have any questions about your discharge instructions: Diet   No. Medications  No. Follow up visit  No.  Do you have questions or concerns about your Care? No.  Actions: * If pain score is 4 or above: No action needed, pain <4.

## 2016-11-02 ENCOUNTER — Other Ambulatory Visit: Payer: Self-pay

## 2016-11-02 ENCOUNTER — Telehealth: Payer: Self-pay | Admitting: Gastroenterology

## 2016-11-02 DIAGNOSIS — A0472 Enterocolitis due to Clostridium difficile, not specified as recurrent: Secondary | ICD-10-CM

## 2016-11-02 NOTE — Telephone Encounter (Signed)
Likely to be a recurrence, but we should know for sure.  If positive, will need vancomycin.  Today or tomorrow, needs to go to lab to arrange specimen for C diff antigen, toxin A/B and PCR testing.

## 2016-11-02 NOTE — Telephone Encounter (Signed)
Spoke with the patient and he agrees to this plan of care.

## 2016-11-02 NOTE — Telephone Encounter (Signed)
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 He was treated with Flagyl. Completed his course about 10 days ago per patient. He  had a colonoscopy on 10/27/16. He calls today with complaints of rectal pain returning and having at least 3 loose stools daily. He had one day of body aches "flu like" but that has resolved. His concern is a return of the c-diff infection. States "these are the symptoms I had with the last infection."

## 2016-11-03 ENCOUNTER — Other Ambulatory Visit: Payer: Self-pay | Admitting: Gastroenterology

## 2016-11-03 ENCOUNTER — Other Ambulatory Visit: Payer: Self-pay

## 2016-11-03 DIAGNOSIS — R197 Diarrhea, unspecified: Secondary | ICD-10-CM

## 2016-11-03 DIAGNOSIS — A0472 Enterocolitis due to Clostridium difficile, not specified as recurrent: Secondary | ICD-10-CM

## 2016-11-03 MED ORDER — METRONIDAZOLE 500 MG PO TABS: 500.0000 mg | ORAL_TABLET | Freq: Three times a day (TID) | ORAL | 0 refills | Status: AC

## 2016-11-04 ENCOUNTER — Telehealth: Payer: Self-pay | Admitting: Gastroenterology

## 2016-11-04 ENCOUNTER — Other Ambulatory Visit: Payer: Self-pay

## 2016-11-04 LAB — CLOSTRIDIUM DIFFICILE BY PCR: Toxigenic C. Difficile by PCR: DETECTED — CR

## 2016-11-04 LAB — C. DIFFICILE GDH AND TOXIN A/B
C. DIFFICILE GDH: DETECTED — AB
C. difficile Toxin A/B: DETECTED — AB

## 2016-11-04 MED ORDER — VANCOMYCIN HCL 125 MG PO CAPS: 125.0000 mg | ORAL_CAPSULE | Freq: Four times a day (QID) | ORAL | 0 refills | Status: AC

## 2016-11-04 NOTE — Telephone Encounter (Signed)
Called patient and informed positive C.diff. Sent Rx for PO Vancomycin four times daily X14 days K. Denzil Magnuson , MD [phone] Mon-Fri 8a-5p [phone] after 5p, weekends, holidays

## 2016-11-04 NOTE — Telephone Encounter (Signed)
[place] to see if they will compound to liquid form to help with cost. Per pharmacist they do not compound the med because it is commercially available. They have the liquid form at the site  Pharmacist states that without insurance the liquid form would be $292 based off sig from capsule form Dr. Loletha Carrow prescribed. I informed pt of this and he will contact [facility] before picking up because he has a coupon for the capsules and wants to make sure which form will be more beneficial to him. He states that he does not have insurance.

## 2016-11-23 ENCOUNTER — Other Ambulatory Visit: Payer: Self-pay

## 2016-11-23 ENCOUNTER — Telehealth: Payer: Self-pay | Admitting: Gastroenterology

## 2016-11-23 DIAGNOSIS — R350 Frequency of micturition: Secondary | ICD-10-CM

## 2016-11-23 NOTE — Telephone Encounter (Signed)
Okay, we should obtain UA with urine culture at this time to ensure no UTI. We will let him know the results. Glad to hear the diarrhea has improved. Thanks

## 2016-11-23 NOTE — Telephone Encounter (Signed)
Since taking the vancomycin patient has been experiencing frequent urination. He denies any pain or discoloration upon urinating. He had finished the flagyl before starting on the vancomycin as it took the pharmacy along time to get the medication to him. Started vancomycin 6 days ago, has 8 more days of medication. He states that he no longer is having diarrhea, but is concerned about the frequent urination, every 2-3 hours around the clock. Please advise.

## 2016-11-23 NOTE — Telephone Encounter (Signed)
Patient will come in tomorrow to do UA and will also get a urine culture. Told him that as soon as we had results we would let him know.

## 2016-11-24 ENCOUNTER — Other Ambulatory Visit (INDEPENDENT_AMBULATORY_CARE_PROVIDER_SITE_OTHER): Payer: Self-pay

## 2016-11-24 DIAGNOSIS — R350 Frequency of micturition: Secondary | ICD-10-CM

## 2016-11-24 LAB — URINALYSIS
Bilirubin Urine: NEGATIVE
HGB URINE DIPSTICK: NEGATIVE
Ketones, ur: NEGATIVE
Leukocytes, UA: NEGATIVE
Nitrite: NEGATIVE
PH: 6 (ref 5.0–8.0)
SPECIFIC GRAVITY, URINE: 1.01 (ref 1.000–1.030)
TOTAL PROTEIN, URINE-UPE24: NEGATIVE
URINE GLUCOSE: NEGATIVE
Urobilinogen, UA: 0.2 (ref 0.0–1.0)

## 2016-11-25 LAB — URINE CULTURE: ORGANISM ID, BACTERIA: NO GROWTH

## 2016-12-08 ENCOUNTER — Encounter: Payer: Self-pay | Admitting: Gastroenterology

## 2016-12-08 ENCOUNTER — Ambulatory Visit (INDEPENDENT_AMBULATORY_CARE_PROVIDER_SITE_OTHER): Payer: Self-pay | Admitting: Gastroenterology

## 2016-12-08 ENCOUNTER — Other Ambulatory Visit (INDEPENDENT_AMBULATORY_CARE_PROVIDER_SITE_OTHER): Payer: Self-pay

## 2016-12-08 ENCOUNTER — Encounter (INDEPENDENT_AMBULATORY_CARE_PROVIDER_SITE_OTHER): Payer: Self-pay

## 2016-12-08 VITALS — BP 102/74 | HR 68 | Ht 74.0 in | Wt 196.1 lb

## 2016-12-08 DIAGNOSIS — Z8619 Personal history of other infectious and parasitic diseases: Secondary | ICD-10-CM

## 2016-12-08 DIAGNOSIS — Z8719 Personal history of other diseases of the digestive system: Secondary | ICD-10-CM

## 2016-12-08 DIAGNOSIS — K602 Anal fissure, unspecified: Secondary | ICD-10-CM

## 2016-12-08 LAB — CBC WITH DIFFERENTIAL/PLATELET
BASOS PCT: 0.3 % (ref 0.0–3.0)
Basophils Absolute: 0 10*3/uL (ref 0.0–0.1)
EOS PCT: 2.1 % (ref 0.0–5.0)
Eosinophils Absolute: 0.1 10*3/uL (ref 0.0–0.7)
HEMATOCRIT: 42.8 % (ref 39.0–52.0)
HEMOGLOBIN: 15 g/dL (ref 13.0–17.0)
LYMPHS PCT: 25 % (ref 12.0–46.0)
Lymphs Abs: 1.6 10*3/uL (ref 0.7–4.0)
MCHC: 35.1 g/dL (ref 30.0–36.0)
MCV: 90.5 fl (ref 78.0–100.0)
MONOS PCT: 8.1 % (ref 3.0–12.0)
Monocytes Absolute: 0.5 10*3/uL (ref 0.1–1.0)
Neutro Abs: 4 10*3/uL (ref 1.4–7.7)
Neutrophils Relative %: 64.5 % (ref 43.0–77.0)
Platelets: 319 10*3/uL (ref 150.0–400.0)
RBC: 4.72 Mil/uL (ref 4.22–5.81)
RDW: 14 % (ref 11.5–15.5)
WBC: 6.2 10*3/uL (ref 4.0–10.5)

## 2016-12-08 LAB — SEDIMENTATION RATE: Sed Rate: 13 mm/hr (ref 0–15)

## 2016-12-08 LAB — HIGH SENSITIVITY CRP: CRP, High Sensitivity: 3.16 mg/L (ref 0.000–5.000)

## 2016-12-08 NOTE — Patient Instructions (Signed)
If you are age 46 or older, your body mass index should be between 23-30. Your Body mass index is 25.18 kg/m. If this is out of the aforementioned range listed, please consider follow up with your Primary Care Provider.  If you are age 50 or younger, your body mass index should be between 19-25. Your Body mass index is 25.18 kg/m. If this is out of the aformentioned range listed, please consider follow up with your Primary Care Provider.   Your physician has requested that you go to the basement for the following lab work before leaving today:  CBC, ESR, CRP  Please use Nitroglycerin Ointment three times daily as directed.  May consider having repeat colonoscopy if symptoms re-occur.  Please call Dr. Havery Moros if diarrhea reoccurs.  Thank you.

## 2016-12-08 NOTE — Progress Notes (Signed)
HPI :  46 y/o male here for a follow up visit. See intake consult note for full history:   Since his initial visit the patient had stool testing done, initially was positive for C diff. He was treated with a course of Flagyl and symptoms resolved, including his rectal pain. He had a colonoscopy on 10/31/16 which showed deep scattered ulcers throughout the colon, worst in the right colon, no mass lesions. At that time he had no symptoms. Biopsies showed nonspecific ulceration, was not diagnostic for IBD. There was a rectal ulcer noted which I thought had perhaps caused his rectal discomfort.   Unfortunately he had another recurrence of symptoms (diarrhea / pain) and tested positive again for C diff. He was then treated with a course of vancomycin, finished it a week ago. He reports it helped his diarrhea and stools firmed back up.  He has had some change in his discomfort which is present intermittent, mild, fluctuates, feels it in the anal canal and different than his prior rectal pain. He is using viscous lidocaine and nitroglycerin PRN which may help some, but has not resolved. He is having about one BM per day, sometimes 2. He is not having any abdominal pains. He was having some dysuria which he had a normal UA. His urinary symptoms have resolved with stopping the vancomycin. He is not having any blood in the stool routinely, only perhaps some drops on the toilet paper with wiping. He denies any NSAID use.   He has been taking some pre-biotic powder, and OTC probiotics, using extra stength Align. He is also taking Sacharyomyces boulardii. He denies any NSAID use.    Past Medical History:  Diagnosis Date  . C. difficile colitis   . GERD (gastroesophageal reflux disease)    no meds now  . Hypertension   . Sleep apnea    does not wear a c-pap  . Thyroid disease      Past Surgical History:  Procedure Laterality Date  . WISDOM TOOTH EXTRACTION     Family History  Problem Relation Age  of Onset  . Hypertension Mother   . Thyroid disease Mother   . Hypertension Father   . Colon cancer Neg Hx   . Esophageal cancer Neg Hx   . Pancreatic cancer Neg Hx   . Prostate cancer Neg Hx   . Rectal cancer Neg Hx   . Stomach cancer Neg Hx    Social History  Substance Use Topics  . Smoking status: Former Smoker    Quit date: 11/13/1997  . Smokeless tobacco: Never Used  . Alcohol use No     Comment: none since Jan 2018   Current Outpatient Prescriptions  Medication Sig Dispense Refill  . nitroGLYCERIN (NITROGLYN) 2 % ointment Apply topically as needed for chest pain.    Marland Kitchen lidocaine (XYLOCAINE) 2 % jelly Apply 1 application topically as needed. 30 mL 1  . losartan-hydrochlorothiazide (HYZAAR) 100-12.5 MG tablet Take 1 tablet by mouth daily.    . NP THYROID 90 MG tablet Take 90 mg by mouth daily.  2   Current Facility-Administered Medications  Medication Dose Route Frequency Provider Last Rate Last Dose  . 0.9 %  sodium chloride infusion  500 mL Intravenous Continuous Ayasha Ellingsen, Renelda Loma, MD       Allergies  Allergen Reactions  . Ciprofloxacin Swelling    Swelling of lower leg muscles     Review of Systems: All systems reviewed and negative except where noted in  HPI.   Lab Results  Component Value Date   WBC 6.2 12/08/2016   HGB 15.0 12/08/2016   HCT 42.8 12/08/2016   MCV 90.5 12/08/2016   PLT 319.0 12/08/2016    CBC Latest Ref Rng & Units 12/08/2016 10/06/2016 10/02/2016  WBC 4.0 - 10.5 K/uL 6.2 14.4(H) 10.6(H)  Hemoglobin 13.0 - 17.0 g/dL 15.0 13.5 13.8  Hematocrit 39.0 - 52.0 % 42.8 38.9(L) 39.4  Platelets 150.0 - 400.0 K/uL 319.0 452.0(H) 368     Physical Exam: BP 102/74   Pulse 68   Ht 6' 2"  (1.88 m)   Wt 196 lb 2 oz (89 kg)   BMI 25.18 kg/m  Constitutional: Pleasant,well-developed, male in no acute distress. HEENT: Normocephalic and atraumatic. Conjunctivae are normal. No scleral icterus. Neck supple.  Cardiovascular: Normal rate, regular rhythm.   Pulmonary/chest: Effort normal and breath sounds normal. No wheezing, rales or rhonchi. Abdominal: Soft, nondistended, nontender. There are no masses palpable. No hepatomegaly. DRE - anal fissure posterior anal canal, pain reproduced with palpation in this area Extremities: no edema Lymphadenopathy: No cervical adenopathy noted. Neurological: Alert and oriented to person place and time. Skin: Skin is warm and dry. No rashes noted. Psychiatric: Normal mood and affect. Behavior is normal.   ASSESSMENT AND PLAN: 46 year old male here for reassessment of the following issues:  Rectal pain / Anal fissure - his previous pain prior to colonoscopy was severe and he had no anal fissure noted at that time although empirically treated him for it. His colonoscopy showed a rectal ulcer which I suspected had been the cause of his symptoms which have resolved with Flagyl use in the setting of C. difficile infection. Now with recurrent mild anal discomfort, noted to have anal fissure on exam, perhaps related to his recent episodes of C. Difficile. This clearly reproduce his symptoms on exam today. Recommend topical 0.125% nitroglycerin ointment 3 times daily for up to 6 weeks, counseled him this can take some time to resolve completely. If no improvement he can contact me for reassessment.  History of C diff - status post treatment with Flagyl and then had recurrence, status post treatment with vancomycin and thus far doing okay. Recommend he continue Saccharomyces supplementation to help prevent recurrence, I don't think he needs to continue take the other probiotics.   History of colitis - nonspecific deep ulcerations of the colon noted on his colonoscopy. This was and is concerning for possible Crohn's disease however the biopsies are not diagnostic. I don't think C diff would have caused this appearance. He has no diarrhea or abdominal pain, only symptoms of anal fissure at this time. He previously had a  leukocytosis in the setting of C. Difficile, will repeat CBC to ensure this is normalized, and also assess inflammatory markers. We may consider an interval colonoscopy pending his course to ensure no evidence of Crohn's disease. He agreed, will await blood work for now. He'll contact me if diarrhea recurs.  Hermitage Cellar, MD [facility] Pager [phone]

## 2017-02-09 ENCOUNTER — Encounter (INDEPENDENT_AMBULATORY_CARE_PROVIDER_SITE_OTHER): Payer: Self-pay

## 2017-02-09 ENCOUNTER — Ambulatory Visit (INDEPENDENT_AMBULATORY_CARE_PROVIDER_SITE_OTHER): Payer: Self-pay | Admitting: Gastroenterology

## 2017-02-09 ENCOUNTER — Encounter: Payer: Self-pay | Admitting: Gastroenterology

## 2017-02-09 VITALS — BP 100/70 | HR 96 | Ht 73.75 in | Wt 192.0 lb

## 2017-02-09 DIAGNOSIS — K6289 Other specified diseases of anus and rectum: Secondary | ICD-10-CM

## 2017-02-09 DIAGNOSIS — R933 Abnormal findings on diagnostic imaging of other parts of digestive tract: Secondary | ICD-10-CM

## 2017-02-09 MED ORDER — LOSARTAN POTASSIUM-HCTZ 100-12.5 MG PO TABS: 1.0000 | ORAL_TABLET | Freq: Every day | ORAL | 1 refills | Status: DC

## 2017-02-09 MED ORDER — DILTIAZEM GEL 2 %: CUTANEOUS | 1 refills | Status: DC

## 2017-02-09 NOTE — Progress Notes (Signed)
HPI :  46 year old male here for follow-up visit. Please see prior notes for full details of his case. 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 He's had recurrent rectal pain, and history of anal fissure and rectal ulceration noted on colonoscopy and anoscopy. He had a colonoscopy on 10/31/16 which showed deep scattered ulcers throughout the colon, worst in the right colon, no mass lesions. Biopsies showed nonspecific ulceration, was not diagnostic for IBD. There was a rectal ulcer noted which I thought had perhaps caused his rectal discomfort.   Since his last visit he was treated with nitroglycerin for posterior anal canal fissure. He reports his symptom of pain are better but not resolved complete. He has some pruritus and itching after bowel movements. He is using wet wipes to clean himself and witch hazel pads which he is not sure if it helps.  He had an episode of blood in his stool a few weeks ago, otherwise he has not been having any blood in the stools for the most part. No recurrent diarrhea, he has been doing well. Solid stools for the most part. He is having roughly one BM per day at most, usually every other day, about 4 BMS per day or so. No abdominal pains. Weight is stable. No NSAIDS   Past Medical History:  Diagnosis Date  . C. difficile colitis   . GERD (gastroesophageal reflux disease)    no meds now  . Hypertension   . Sleep apnea    does not wear a c-pap  . Thyroid disease      Past Surgical History:  Procedure Laterality Date  . WISDOM TOOTH EXTRACTION     Family History  Problem Relation Age of Onset  . Hypertension Mother   . Thyroid disease Mother   . Hypertension Father   . Colon cancer Neg Hx   . Esophageal cancer Neg Hx   . Pancreatic cancer Neg Hx   . Prostate cancer Neg Hx   . Rectal cancer Neg Hx   . Stomach cancer Neg Hx    Social History  Substance Use Topics  . Smoking status: Former  Smoker    Quit date: 11/13/1997  . Smokeless tobacco: Never Used  . Alcohol use No     Comment: none since Jan 2018   Current Outpatient Prescriptions  Medication Sig Dispense Refill  . lidocaine (XYLOCAINE) 2 % jelly Apply 1 application topically as needed. 30 mL 1  . losartan-hydrochlorothiazide (HYZAAR) 100-12.5 MG tablet Take 1 tablet by mouth daily.    . nitroGLYCERIN (NITROGLYN) 2 % ointment Apply topically as needed for chest pain.    . NP THYROID 90 MG tablet Take 90 mg by mouth daily.  2   Current Facility-Administered Medications  Medication Dose Route Frequency Provider Last Rate Last Dose  . 0.9 %  sodium chloride infusion  500 mL Intravenous Continuous Armbruster, Renelda Loma, MD       Allergies  Allergen Reactions  . Ciprofloxacin Swelling    Swelling of lower leg muscles     Review of Systems: All systems reviewed and negative except where noted in HPI.   Lab Results  Component Value Date   WBC 6.2 12/08/2016   HGB 15.0 12/08/2016   HCT 42.8 12/08/2016   MCV 90.5 12/08/2016   PLT 319.0 12/08/2016   Lab Results  Component Value Date   CREATININE 0.93 10/02/2016   BUN 10 10/02/2016   NA  138 10/02/2016   K 3.5 10/02/2016   CL 103 10/02/2016   CO2 26 10/02/2016    Lab Results  Component Value Date   ALT 23 10/02/2016   AST 22 10/02/2016   ALKPHOS 82 10/02/2016   BILITOT 0.8 10/02/2016      Physical Exam: BP 100/70 (BP Location: Left Arm, Patient Position: Sitting, Cuff Size: Normal)   Pulse 96   Ht 6' 1.75" (1.873 m) Comment: height measured without shoes  Wt 192 lb (87.1 kg)   BMI 24.82 kg/m  Constitutional: Pleasant,well-developed, male in no acute distress. HEENT: Normocephalic and atraumatic. Conjunctivae are normal. No scleral icterus. Neck supple.  Cardiovascular: Normal rate, regular rhythm.  Pulmonary/chest: Effort normal and breath sounds normal. No wheezing, rales or rhonchi. Abdominal: Soft, nondistended, nontender. Bowel sounds  active throughout. There are no masses palpable. No hepatomegaly. DRE - suspected posterior fissure deeper into anal canal, severe discomfort with DRE exam, no mass palpated Extremities: no edema Lymphadenopathy: No cervical adenopathy noted. Neurological: Alert and oriented to person place and time. Skin: Skin is warm and dry. No rashes noted. Psychiatric: Normal mood and affect. Behavior is normal.   ASSESSMENT AND PLAN: 46 year old male with history as outlined above, presenting with ongoing rectal discomfort. He's been treated for C. difficile for prior diarrhea which has resolved. Prior colonoscopy showed deep ulcers in the colon in a patchy distribution although not overtly consistent with Crohn's. Biopsies were not c/w Crohns initially. He has no abdominal pain or diarrhea at this point, only rectal discomfort which has been improved with topical nitroglycerin. On DRE today while I appreciate what appears to be a fissure in the more proximal anal canal, he is extremely tender which prohibits a full exam. Don't see any obvious evidence of abscess or fistula. Not sure if this pain is simply due to fissure or ulceration noted in the rectum during prior colonoscopy.  Recommending an endoscopic evaluation to further evaluate the rectum and anal rectal canal given I couldn't visualize much with anoscopy as he didn't tolerate it today, given his ongoing symptoms. In discussing options with him, while a colonoscopy would be useful to ensure interval healing of prior colonic ulcerations, he doesn't think he can tolerate a bowel prep at this time. In this light will perform flex sigmoidoscopy with sedation, examine as much as we can. In the interim we'll change nitroglycerin to diltiazem ointment mixed with lidocaine to see if this provides further relief, flex sig to be done in the near future.  He agreed with the plan.  Parshall Cellar, MD [facility] Pager [phone]

## 2017-02-09 NOTE — Patient Instructions (Addendum)
If you are age 48 or older, your body mass index should be between 23-30. Your Body mass index is 24.82 kg/m. If this is out of the aforementioned range listed, please consider follow up with your Primary Care Provider.  If you are age 3 or younger, your body mass index should be between 19-25. Your Body mass index is 24.82 kg/m. If this is out of the aformentioned range listed, please consider follow up with your Primary Care Provider.   You have been scheduled for a flexible sigmoidoscopy. Please follow the written instructions given to you at your visit today. If you use inhalers (even only as needed), please bring them with you on the day of your procedure.  Discontinue Nitroglycerin  Start Diltiazem gel with Lidocaine three times daily. Please pick this up at [facility].  You may have a light breakfast the morning of prep day (the day before the procedure).   You may choose from one of the following items: eggs and toast OR chicken noodle soup and crackers.   You should have your breakfast completed between 8:00 and 9:00 am the day before your procedure.    After you have had your light breakfast you should start a clear liquid diet only, NO SOLIDS. No additional solid food is allowed. You may continue to have clear liquid up to 3 hours prior to your procedure.     Thank you.

## 2017-02-19 DIAGNOSIS — Z8619 Personal history of other infectious and parasitic diseases: Secondary | ICD-10-CM | POA: Insufficient documentation

## 2017-02-22 ENCOUNTER — Ambulatory Visit (AMBULATORY_SURGERY_CENTER): Payer: Self-pay | Admitting: Gastroenterology

## 2017-02-22 ENCOUNTER — Encounter: Payer: Self-pay | Admitting: Gastroenterology

## 2017-02-22 VITALS — BP 116/80 | HR 92 | Temp 98.9°F | Resp 14 | Ht 73.0 in | Wt 192.0 lb

## 2017-02-22 DIAGNOSIS — K6289 Other specified diseases of anus and rectum: Secondary | ICD-10-CM

## 2017-02-22 MED ORDER — METRONIDAZOLE 500 MG PO TABS: 500.0000 mg | ORAL_TABLET | Freq: Three times a day (TID) | ORAL | 0 refills | Status: AC

## 2017-02-22 MED ORDER — SODIUM CHLORIDE 0.9 % IV SOLN: 500.0000 mL | INTRAVENOUS | Status: AC

## 2017-02-22 NOTE — Op Note (Signed)
Hayden Bennett: Hayden Bennett Procedure Date: 02/22/2017 3:32 PM MRN: [id number] Endoscopist: Remo Lipps P. Czarina Gingras MD, MD Age: 46 Referring MD:  Date of Birth: 05-22-1971 Gender: Male Account #: [id number] Procedure:                Flexible Sigmoidoscopy Indications:              Anal pain - not improved with conservative therapy                            (nitroglycerin ointment). Remote CT pelvis imaging                            negative for abscess. History of C diff and                            ulcerations of the colon on prior colonoscopy.                            Diarrhea has resolved Medicines:                Monitored Anesthesia Care Procedure:                Pre-Anesthesia Assessment:                           - Prior to the procedure, a History and Physical                            was performed, and patient medications and                            allergies were reviewed. The patient's tolerance of                            previous anesthesia was also reviewed. The risks                            and benefits of the procedure and the sedation                            options and risks were discussed with the patient.                            All questions were answered, and informed consent                            was obtained. Prior Anticoagulants: The patient has                            taken no previous anticoagulant or antiplatelet                            agents. ASA Grade Assessment: II - A patient with  mild systemic disease. After reviewing the risks                            and benefits, the patient was deemed in                            satisfactory condition to undergo the procedure.                           After obtaining informed consent, the scope was                            passed under direct vision. The Colonoscope was                            introduced through the anus and  advanced to the the                            sigmoid colon. The flexible sigmoidoscopy was                            accomplished without difficulty. The patient                            tolerated the procedure well. The quality of the                            bowel preparation was adequate. Scope In: 3:50:35 PM Scope Out: 3:58:49 PM Total Procedure Duration: 0 hours 8 minutes 14 seconds  Findings:                 The perianal exam findings include changes                            concerning for possible anal fissure versus                            fistulous tract versus ulceration. This was seen                            well on DRE when under anesthesia and                            endoscopically. This is clearly the source of the                            patient's symptoms                           The rectum and sigmoid colon appeared normal. No                            inflammatory changes. Previously noted ulcerations  of the left colon have since resolved. Complications:            No immediate complications. Estimated blood loss:                            None. Estimated Blood Loss:     Estimated blood loss: none. 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                           - The rectum and sigmoid colon are normal.                           - No specimens collected. Recommendation:           - Discharge patient to home.                           - Resume previous diet.                           - Continue present medications.                           - Start flagyl 572m TID for 2 week trial (allergy                            to Cipro)                           - Switch from nitroglycerin to diltiazem /                            lidocaine ointment                           - MRI pelvis - ensure no fistulous tract                            - If symptoms do not improve, and pending MRI                            findings, patient may warrant surgical evaluation                            for EUA Lochlin Eppinger P. Jonne Rote MD, MD 02/22/2017 4:11:15 PM This report has been signed electronically.

## 2017-02-22 NOTE — Patient Instructions (Signed)
Patient for MRI, office will call you with appointment date.  Pick up new prescription from your pharmacy, take as directed.   Call us with any questions or concerns. Thank you!  YOU HAD AN ENDOSCOPIC PROCEDURE TODAY AT [facility]:   Refer to the procedure report that was given to you for any specific questions about what was found during the examination.  If the procedure report does not answer your questions, please call your gastroenterologist to clarify.  If you requested that your care partner not be given the details of your procedure findings, then the procedure report has been included in a sealed envelope for you to review at your convenience later.  YOU SHOULD EXPECT: Some feelings of bloating in the abdomen. Passage of more gas than usual.  Walking can help get rid of the air that was put into your GI tract during the procedure and reduce the bloating. If you had a lower endoscopy (such as a colonoscopy or flexible sigmoidoscopy) you may notice spotting of blood in your stool or on the toilet paper. If you underwent a bowel prep for your procedure, you may not have a normal bowel movement for a few days.  Please Note:  You might notice some irritation and congestion in your nose or some drainage.  This is from the oxygen used during your procedure.  There is no need for concern and it should clear up in a day or so.  SYMPTOMS TO REPORT IMMEDIATELY:   Following lower endoscopy (colonoscopy or flexible sigmoidoscopy):  Excessive amounts of blood in the stool  Significant tenderness or worsening of abdominal pains  Swelling of the abdomen that is new, acute  Fever of 100F or higher  For urgent or emergent issues, a gastroenterologist can be reached at any hour by calling [phone].   DIET:  We do recommend a small meal at first, but then you may proceed to your regular diet.  Drink plenty of fluids but you should avoid alcoholic beverages for 24  hours.  ACTIVITY:  You should plan to take it easy for the rest of today and you should NOT DRIVE or use heavy machinery until tomorrow (because of the sedation medicines used during the test).    FOLLOW UP: Our staff will call the number listed on your records the next business day following your procedure to check on you and address any questions or concerns that you may have regarding the information given to you following your procedure. If we do not reach you, we will leave a message.  However, if you are feeling well and you are not experiencing any problems, there is no need to return our call.  We will assume that you have returned to your regular daily activities without incident.  If any biopsies were taken you will be contacted by phone or by letter within the next 1-3 weeks.  Please call us at [phone] if you have not heard about the biopsies in 3 weeks.    SIGNATURES/CONFIDENTIALITY: You and/or your care partner have signed paperwork which will be entered into your electronic medical record.  These signatures attest to the fact that that the information above on your After Visit Summary has been reviewed and is understood.  Full responsibility of the confidentiality of this discharge information lies with you and/or your care-partner.

## 2017-02-22 NOTE — Progress Notes (Signed)
To recovery, report to RN, VSS. 

## 2017-02-23 ENCOUNTER — Telehealth: Payer: Self-pay | Admitting: *Deleted

## 2017-02-23 ENCOUNTER — Other Ambulatory Visit: Payer: Self-pay

## 2017-02-23 NOTE — Telephone Encounter (Signed)
  Follow up Call-  Call back number 02/22/2017 10/27/2016  Post procedure Call Back phone  # [phone] cell  Permission to leave phone message Yes Yes  Some recent data might be hidden     Patient questions:  Do you have a fever, pain , or abdominal swelling? No. Pain Score  0 *  Have you tolerated food without any problems? Yes.    Have you been able to return to your normal activities? Yes.    Do you have any questions about your discharge instructions: Diet   No. Medications  No. Follow up visit  No.  Do you have questions or concerns about your Care? No.  Actions: * If pain score is 4 or above: No action needed, pain <4.

## 2017-02-24 ENCOUNTER — Other Ambulatory Visit: Payer: Self-pay

## 2017-02-24 ENCOUNTER — Telehealth: Payer: Self-pay

## 2017-02-24 DIAGNOSIS — K602 Anal fissure, unspecified: Secondary | ICD-10-CM

## 2017-02-24 NOTE — Telephone Encounter (Signed)
Order placed for MRI pelvis, let patient know that [facility] will be call him to schedule this test.

## 2017-02-24 NOTE — Telephone Encounter (Signed)
-----   Message from Manus Gunning, MD sent at 02/22/2017  4:23 PM EDT ----- Regarding: MRI pelvis [name] would you mind calling this patient to schedule him for an MRI pelvis with contrast to rule out perianal fistula? He just had a flex sig today, he can be called tomorrow. Thanks much

## 2017-03-14 ENCOUNTER — Ambulatory Visit
Admission: RE | Admit: 2017-03-14 | Discharge: 2017-03-14 | Disposition: A | Discharge status: Home / Self Care | Payer: Self-pay | Source: Ambulatory Visit | Attending: Gastroenterology | Admitting: Gastroenterology

## 2017-03-14 DIAGNOSIS — K602 Anal fissure, unspecified: Secondary | ICD-10-CM

## 2017-03-14 MED ORDER — GADOBENATE DIMEGLUMINE 529 MG/ML IV SOLN
19.0000 mL | Freq: Once | INTRAVENOUS | Status: AC | PRN
  Administered 2017-03-14: 19 mL via INTRAVENOUS

## 2017-03-17 ENCOUNTER — Other Ambulatory Visit: Payer: Self-pay

## 2017-03-17 MED ORDER — OXYCODONE-ACETAMINOPHEN 5-325 MG PO TABS: 1.0000 | ORAL_TABLET | Freq: Four times a day (QID) | ORAL | 0 refills | Status: DC | PRN

## 2017-03-18 ENCOUNTER — Telehealth: Payer: Self-pay

## 2017-03-18 NOTE — Telephone Encounter (Signed)
Faxed referral information to [facility] at [phone], spoke to scheduling. They took his information, but due to know insurance they will need financial counseling services to contact patient first before scheduling appointment to see Dr. Renelda Mom. They will contact patient.

## 2017-04-09 ENCOUNTER — Telehealth: Payer: Self-pay

## 2017-04-09 NOTE — Telephone Encounter (Signed)
Called again to [facility] to check on appointment with Dr. Drue Flirt for anal fissure, their financial counselors called patient but he was unable to speak with them. They will try calling him again.

## 2017-04-12 NOTE — Telephone Encounter (Signed)
Okay thanks for the update

## 2017-05-04 ENCOUNTER — Telehealth: Payer: Self-pay

## 2017-05-04 NOTE — Telephone Encounter (Signed)
I still do not see where patient is scheduled to be seen by Dr. Drue Flirt at [facility].

## 2017-05-31 ENCOUNTER — Telehealth: Payer: Self-pay | Admitting: Gastroenterology

## 2017-05-31 NOTE — Telephone Encounter (Signed)
Spoke to Hayden Bennett at [facility] [place], patient has not returned any of their phone calls. They have also mailed out financial paperwork and he has not returned that. I have sent him a letter at the end of October as a reminder to contact them. I do not see where patient has been seen by anyone else.

## 2017-06-01 NOTE — Telephone Encounter (Signed)
Hayden Bennett can you touch base with this patient, see how he is doing, and if he had ever followed up for surgical consult for refractory anal fissure? Thanks

## 2017-06-01 NOTE — Telephone Encounter (Signed)
I had to leave voice message for patient, trying to follow up with him and referral to [facility]. Reminded him that they are waiting on his paperwork information. Asked that he call back to let us know if he is still in need of being seen and still having a problem or if he has been seen by another physician.

## 2017-08-18 ENCOUNTER — Other Ambulatory Visit: Payer: Self-pay | Admitting: Gastroenterology

## 2017-08-18 NOTE — Telephone Encounter (Signed)
Would you like to continue to fill Hyzaar?

## 2017-08-18 NOTE — Telephone Encounter (Signed)
Jan this is something I do not manage chronically. If he needs an emergency refill I will give him 30 days worth. Otherwise he needs to see his primary care for long term refills of this.

## 2017-08-19 NOTE — Telephone Encounter (Signed)
Called and LM for pt to call back.

## 2017-08-19 NOTE — Telephone Encounter (Signed)
Called pt and left 2nd message at home.  Tried him at work but He is not at work today.  Per Dr. Loni Muse - PCP needs to manage his Hyzaar prescription.

## 2017-09-07 ENCOUNTER — Telehealth: Payer: Self-pay | Admitting: Gastroenterology

## 2017-09-07 NOTE — Telephone Encounter (Signed)
It's possible that any antibiotic can precipitate C diff.  If he needs the antibiotic I think okay to take it, but would take a probiotic such as Florastor BID along with it in hopes of preventing a recurrence. Azithromycin can commonly cause diarrhea, so this may be expected. If he has persistent symptoms or that of concern for recurrent C Diff he should contact us. Thanks

## 2017-09-07 NOTE — Telephone Encounter (Signed)
Please advise 

## 2017-09-07 NOTE — Telephone Encounter (Signed)
Patient states he has a possible upper respiratory infection and was prescribed a zpack. Pt wants to know if this could possibly give him cdiff again.

## 2017-09-07 NOTE — Telephone Encounter (Signed)
Patient advised to get Florastor and take it BID. He understands that any antibiotics cause potentially precipitate C diff.

## 2018-01-05 ENCOUNTER — Other Ambulatory Visit: Payer: Self-pay | Admitting: Gastroenterology

## 2018-01-05 NOTE — Telephone Encounter (Signed)
Hi Jan, Is the patient requesting this or pharmacy called to refill? Just curious if he is still using it. Okay to refill if needed

## 2018-01-05 NOTE — Telephone Encounter (Signed)
Not seen since 02-2017. Ok to refill?

## 2018-02-14 ENCOUNTER — Telehealth: Payer: Self-pay | Admitting: Gastroenterology

## 2018-02-14 NOTE — Telephone Encounter (Signed)
Routed to DOD, Dr. Havery Moros patient, states that he has had loose stools, not watery diarrhea. He has had intermittent abdominal pain, last day or so has had severe rectal pain after bm. He states this is how his symptoms developed last time. He does have diltiazem 2% gel with lidocaine that he has been using.

## 2018-02-14 NOTE — Telephone Encounter (Signed)
He needs to be sen in the office but can also order stool for C diff while we are waiting for that test result  Given the inflammation and problems he has had before I think regardless he needs a visit in next 1-2 weeks with Dr. Loni Muse or app but to clarify order the C diff PCR and can treat with the diltiazem

## 2018-02-14 NOTE — Telephone Encounter (Signed)
Patient states he is having cdiff symptoms again and wants to know if he needs to do a stool sample or come in to the office.

## 2018-02-15 ENCOUNTER — Other Ambulatory Visit: Payer: Self-pay

## 2018-02-15 DIAGNOSIS — R197 Diarrhea, unspecified: Secondary | ICD-10-CM

## 2018-02-15 NOTE — Telephone Encounter (Signed)
Left message for patient that I have scheduled him to follow up here in the office with JL and have ordered C diff stool test. He will need to come to our lab to pick up the container.

## 2018-03-01 ENCOUNTER — Other Ambulatory Visit: Payer: Self-pay

## 2018-03-01 DIAGNOSIS — R197 Diarrhea, unspecified: Secondary | ICD-10-CM

## 2018-03-02 LAB — CLOSTRIDIUM DIFFICILE BY PCR: Toxigenic C. Difficile by PCR: NEGATIVE

## 2018-03-03 ENCOUNTER — Ambulatory Visit (INDEPENDENT_AMBULATORY_CARE_PROVIDER_SITE_OTHER): Payer: Self-pay | Admitting: Physician Assistant

## 2018-03-03 ENCOUNTER — Ambulatory Visit: Payer: Self-pay | Admitting: Physician Assistant

## 2018-03-03 ENCOUNTER — Encounter: Payer: Self-pay | Admitting: Physician Assistant

## 2018-03-03 ENCOUNTER — Other Ambulatory Visit (INDEPENDENT_AMBULATORY_CARE_PROVIDER_SITE_OTHER): Payer: Self-pay

## 2018-03-03 VITALS — BP 94/60 | HR 96 | Ht 73.75 in | Wt 185.5 lb

## 2018-03-03 DIAGNOSIS — R1084 Generalized abdominal pain: Secondary | ICD-10-CM

## 2018-03-03 DIAGNOSIS — R194 Change in bowel habit: Secondary | ICD-10-CM

## 2018-03-03 DIAGNOSIS — K6289 Other specified diseases of anus and rectum: Secondary | ICD-10-CM

## 2018-03-03 LAB — CBC WITH DIFFERENTIAL/PLATELET
Basophils Absolute: 0.1 10*3/uL (ref 0.0–0.1)
Basophils Relative: 0.5 % (ref 0.0–3.0)
EOS PCT: 3 % (ref 0.0–5.0)
Eosinophils Absolute: 0.3 10*3/uL (ref 0.0–0.7)
HEMATOCRIT: 38.3 % — AB (ref 39.0–52.0)
Hemoglobin: 13.2 g/dL (ref 13.0–17.0)
LYMPHS PCT: 13.4 % (ref 12.0–46.0)
Lymphs Abs: 1.3 10*3/uL (ref 0.7–4.0)
MCHC: 34.5 g/dL (ref 30.0–36.0)
MCV: 92 fl (ref 78.0–100.0)
MONO ABS: 0.7 10*3/uL (ref 0.1–1.0)
Monocytes Relative: 7.6 % (ref 3.0–12.0)
NEUTROS ABS: 7.2 10*3/uL (ref 1.4–7.7)
Neutrophils Relative %: 75.5 % (ref 43.0–77.0)
PLATELETS: 381 10*3/uL (ref 150.0–400.0)
RBC: 4.16 Mil/uL — ABNORMAL LOW (ref 4.22–5.81)
RDW: 12.4 % (ref 11.5–15.5)
WBC: 9.5 10*3/uL (ref 4.0–10.5)

## 2018-03-03 LAB — COMPREHENSIVE METABOLIC PANEL
ALT: 14 U/L (ref 0–53)
AST: 15 U/L (ref 0–37)
Albumin: 3.8 g/dL (ref 3.5–5.2)
Alkaline Phosphatase: 76 U/L (ref 39–117)
BUN: 10 mg/dL (ref 6–23)
CHLORIDE: 103 meq/L (ref 96–112)
CO2: 30 meq/L (ref 19–32)
CREATININE: 0.99 mg/dL (ref 0.40–1.50)
Calcium: 9 mg/dL (ref 8.4–10.5)
GFR: 85.66 mL/min (ref >60.00)
Glucose, Bld: 112 mg/dL — ABNORMAL HIGH (ref 70–99)
Potassium: 3.8 mEq/L (ref 3.5–5.1)
SODIUM: 138 meq/L (ref 135–145)
Total Bilirubin: 0.4 mg/dL (ref 0.2–1.2)
Total Protein: 7.2 g/dL (ref 6.0–8.3)

## 2018-03-03 MED ORDER — METRONIDAZOLE 500 MG PO TABS: 500.0000 mg | ORAL_TABLET | Freq: Three times a day (TID) | ORAL | 0 refills | Status: DC

## 2018-03-03 NOTE — Progress Notes (Signed)
Chief Complaint: Change in bowel habits, rectal pain  HPI:    Hayden Bennett is a 47 year old male with a past medical history as listed below, who follows with Dr. Havery Moros and presents clinic today for complaint of diarrhea.    Patient is known to Dr. Havery Moros for his long history of anal pain.  02/22/2017 flex sigmoidoscopy showed an anal fissure/ulceration versus fistulous tract which were thought to be causing his pain.  He was started on Flagyl 500 mg 3 times daily for 2 weeks and switched from Nitroglycerin to Diltiazem/lidocaine ointment.  He also had an MRI of the pelvis which did not show a fistulous track at that time.  It was recommended that if patient continued with pain then he may warrant surgical evaluation.    02/09/2017 saw Dr. 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  Also discussed recurrent rectal pain and history of anal fissure and rectal ulceration.    02/14/2018 called our office with a complaint of loose, not watery stools and intermittent abdominal pain as well as severe rectal pain after bowel movements.  At that time he was told to use Diltiazem 2% gel with lidocaine.    03/01/2018 C. difficile PCR test was negative.    Today, describes that over the past few months he is started with a "indigestion/churning" in his stomach about 2 hours after eating which typically leads to a bowel movement.  These bowel movements are different from normal and he notes that occasionally there is diarrhea and other times it is just "sludgy".  Initially had a lot of gas but stopped his fiber supplement and this helped some.  Also describes a constant generalized abdominal discomfort rated as a 1-2/10 which is worse right before a bowel movement and only slightly better afterwards.  Also with some urgency to his stools.  Over the past couple of weeks has also noted some chills at night and cold sweats while sleeping.  Tells me  that he did have exception of about 3 days last week where he felt completely fine and was having normal bowel movements.      Also complains of severe rectal pain with some bleeding and also oozing of a discharge into his underwear on a daily basis.  Has been using his Diltiazem/lidocaine ointment which does help some but does not completely alleviate his symptoms.    Social history positive for not having any insurance and " I do not have any money".    Denies weight loss, heartburn or reflux.  Past Medical History:  Diagnosis Date  . C. difficile colitis   . GERD (gastroesophageal reflux disease)    no meds now  . Hypertension   . Sleep apnea    does not wear a c-pap  . Thyroid disease     Past Surgical History:  Procedure Laterality Date  . WISDOM TOOTH EXTRACTION      Current Outpatient Medications  Medication Sig Dispense Refill  . diltiazem 2 % GEL [place] 2% Diltiazem gel with 5% lidocaine  Apply per rectum three times daily 45 g 1  . lidocaine (XYLOCAINE) 2 % jelly Apply 1 application topically as needed. 30 mL 1  . lidocaine (XYLOCAINE) 5 % ointment APPLY TO RECTUM 3 TIMES DAILY. 30 g 0  . losartan-hydrochlorothiazide (HYZAAR) 100-12.5 MG tablet Take 1 tablet by mouth daily. 90 tablet 1  . nitroGLYCERIN (NITROGLYN) 2 % ointment Apply topically as needed for chest pain.    Marland Kitchen  NP THYROID 90 MG tablet Take 90 mg by mouth daily.  2  . oxyCODONE-acetaminophen (ROXICET) 5-325 MG tablet Take 1 tablet by mouth every 6 (six) hours as needed for severe pain. 20 tablet 0   Current Facility-Administered Medications  Medication Dose Route Frequency Provider Last Rate Last Dose  . 0.9 %  sodium chloride infusion  500 mL Intravenous Continuous Hayden Bennett, Hayden Raspberry, MD      . 0.9 %  sodium chloride infusion  500 mL Intravenous Continuous Hayden Bennett, Hayden Raspberry, MD        Allergies as of 03/03/2018 - Review Complete 02/22/2017  Allergen Reaction Noted  . Ciprofloxacin Swelling  10/02/2016    Family History  Problem Relation Age of Onset  . Hypertension Mother   . Thyroid disease Mother   . Hypertension Father   . Colon cancer Neg Hx   . Esophageal cancer Neg Hx   . Pancreatic cancer Neg Hx   . Prostate cancer Neg Hx   . Rectal cancer Neg Hx   . Stomach cancer Neg Hx     Social History   Socioeconomic History  . Marital status: Married    Spouse name: Not on file  . Number of children: Not on file  . Years of education: Not on file  . Highest education level: Not on file  Occupational History  . Not on file  Social Needs  . Financial resource strain: Not on file  . Food insecurity:    Worry: Not on file    Inability: Not on file  . Transportation needs:    Medical: Not on file    Non-medical: Not on file  Tobacco Use  . Smoking status: Former Smoker    Last attempt to quit: 11/13/1997    Years since quitting: 20.3  . Smokeless tobacco: Never Used  Substance and Sexual Activity  . Alcohol use: No    Comment: none since Jan 2018  . Drug use: No  . Sexual activity: Not on file  Lifestyle  . Physical activity:    Days per week: Not on file    Minutes per session: Not on file  . Stress: Not on file  Relationships  . Social connections:    Talks on phone: Not on file    Gets together: Not on file    Attends religious service: Not on file    Active member of club or organization: Not on file    Attends meetings of clubs or organizations: Not on file    Relationship status: Not on file  . Intimate partner violence:    Fear of current or ex partner: Not on file    Emotionally abused: Not on file    Physically abused: Not on file    Forced sexual activity: Not on file  Other Topics Concern  . Not on file  Social History Narrative  . Not on file    Review of Systems:    Constitutional: No weight loss, fever or chills Cardiovascular: No chest pain Respiratory: No SOB  Gastrointestinal: See HPI and otherwise negative   Physical  Exam:  Vital signs: BP 94/60 (BP Location: Left Arm, Patient Position: Sitting, Cuff Size: Normal)   Pulse 96   Ht 6' 1.75" (1.873 m)   Wt 185 lb 8 oz (84.1 kg)   BMI 23.98 kg/m   Constitutional:   Pleasant Caucasian male appears to be in NAD, Well developed, Well nourished, alert and cooperative Respiratory: Respirations even and unlabored. Lungs  clear to auscultation bilaterally.   No wheezes, crackles, or rhonchi.  Cardiovascular: Normal S1, S2. No MRG. Regular rate and rhythm. No peripheral edema, cyanosis or pallor.  Gastrointestinal:  Soft, nondistended, nontender. No rebound or guarding. Normal bowel sounds. No appreciable masses or hepatomegaly. Rectal:  External: visualized purulent material from rectum, also opening to fistulous tract, very tender to palpation limiting internal exam Psychiatric: Demonstrates good judgement and reason without abnormal affect or behaviors.  No recent labs or imaging.  Assessment: 1.  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  Change in bowel habits: Change in consistency, sometimes loose, never solid, C. difficile recently negative, remote history of Giardia; consider relation to abscess versus perianal disease versus IBS versus other 3. Generalized abdominal pain: consider relation to IBS vs above process vs other  Plan: 1.  Prescribed Metronidazole 500 mg 3 times daily x2 weeks 2.  Referred patient again to [facility] for further evaluation and possible exam under anesthesia 3.  We will hold off on pelvic MRI at this time as patient does not have insurance, instructed him that if [facility] would like further imaging they may order some then. 4.  Patient to continue Diltiazem/lidocaine. 5.  Ordered further stool studies to include a GI pathogen panel and O&P 6.  Ordered labs to include a CBC and CMP 7.  Provided patient with information regarding patient assistance  program 8.  Patient to follow-up in clinic with [place] as needed in the near future.  Did instruct him to call us in the next 2 weeks to let us know how he is doing.  Ellouise Newer, PA-C [facility] 03/03/2018, 2:46 PM

## 2018-03-03 NOTE — Patient Instructions (Signed)
If you are age 48 or older, your body mass index should be between 23-30. Your Body mass index is 23.98 kg/m. If this is out of the aforementioned range listed, please consider follow up with your Primary Care Provider.  If you are age 67 or younger, your body mass index should be between 19-25. Your Body mass index is 23.98 kg/m. If this is out of the aformentioned range listed, please consider follow up with your Primary Care Provider.   We have sent the following medications to your pharmacy for you to pick up at your convenience: Flagyl 500 mg  Your provider has requested that you go to the basement level for lab work before leaving today. Press "B" on the elevator. The lab is located at the first door on the left as you exit the elevator.  You are being referred to [facility] for fistula.  You have been given [facility] Patient Assistance form.  Please call us in 1-2 weeks to let us know how you are doing.  Thank you for choosing me and [facility].  Lovett Calender

## 2018-03-03 NOTE — Progress Notes (Signed)
Agree with assessment and plan as outlined. He's had recurrent rectal pain with evidence of fissure and changes concerning for fistula on gross exam previously (see prior pictures from flex sig). Prior MRI and CT of the pelvis have not shown a fistula however nor any evidence of abscess. Agree with antibiotics with flagyl, he has responded well to them in the past. I think he warrants a surgical evaluation at this point, likely warrants an exam under anesthesia. Insurance has been an issue, previously referred to [facility] colorectal surgery but he did not follow through. He could have perianal Crohn's disease. Anderson Malta as long as no evidence of perirectal abscess on rectal exam I think okay for surgical evaluation to be done in their first available slot. Otherwise antibiotics and topical diltiazem / lidocaine in the interim. If he has fevers or worsening symptoms despite antibiotics in the interim he will need imaging to ensure no abscess.

## 2018-03-08 ENCOUNTER — Other Ambulatory Visit: Payer: Self-pay

## 2018-03-08 DIAGNOSIS — K6289 Other specified diseases of anus and rectum: Secondary | ICD-10-CM

## 2018-03-08 DIAGNOSIS — R194 Change in bowel habit: Secondary | ICD-10-CM

## 2018-03-08 DIAGNOSIS — R1084 Generalized abdominal pain: Secondary | ICD-10-CM

## 2018-03-10 LAB — GASTROINTESTINAL PATHOGEN PANEL PCR
C. DIFFICILE TOX A/B, PCR: NOT DETECTED
Campylobacter, PCR: NOT DETECTED
Cryptosporidium, PCR: NOT DETECTED
E COLI (STEC) STX1/STX2, PCR: NOT DETECTED
E coli (ETEC) LT/ST PCR: NOT DETECTED
E coli 0157, PCR: NOT DETECTED
Giardia lamblia, PCR: NOT DETECTED
NOROVIRUS, PCR: NOT DETECTED
ROTAVIRUS, PCR: NOT DETECTED
SALMONELLA, PCR: NOT DETECTED
Shigella, PCR: NOT DETECTED

## 2018-03-11 ENCOUNTER — Telehealth: Payer: Self-pay | Admitting: Emergency Medicine

## 2018-03-11 LAB — OVA AND PARASITE EXAMINATION
CONCENTRATE RESULT:: NONE SEEN
MICRO NUMBER:: 91048941
SPECIMEN QUALITY:: ADEQUATE
TRICHROME RESULT:: NONE SEEN

## 2018-03-11 NOTE — Telephone Encounter (Signed)
Received fax from [place] patient has appointment scheduled with Dr. Johney Maine on 03-22-18.

## 2018-03-21 IMAGING — CT CT PELVIS W/ CM
2 of 3 series · 11 of 46 positions shown, 12 images · IV contrast (Iodine)
Comparison: CT abdomen pelvis 10/02/2016

CLINICAL DATA: Rectal pain and bleeding.  Leukocytosis.

EXAM:
CT PELVIS WITH CONTRAST
TECHNIQUE: Multidetector CT imaging of the pelvis was performed using the
standard protocol following the bolus administration of intravenous
contrast.
CONTRAST:  75 mL Isovue 300 IV

[Series 201: routine, idose (2) · axial · 0.94mm/px · z∈[-412,-77]mm · 8 of 79 slices shown, 9 images]
[im 6/79  soft-tissue]
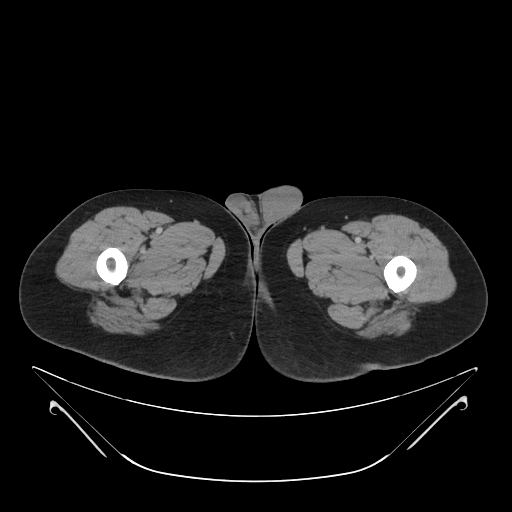
[im 6/79  bone]
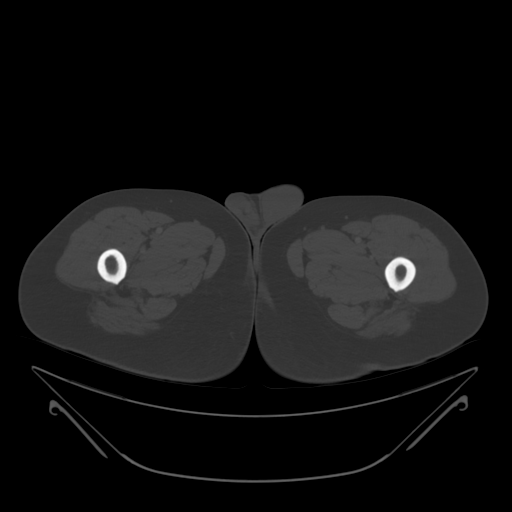
[im 16/79  soft-tissue]
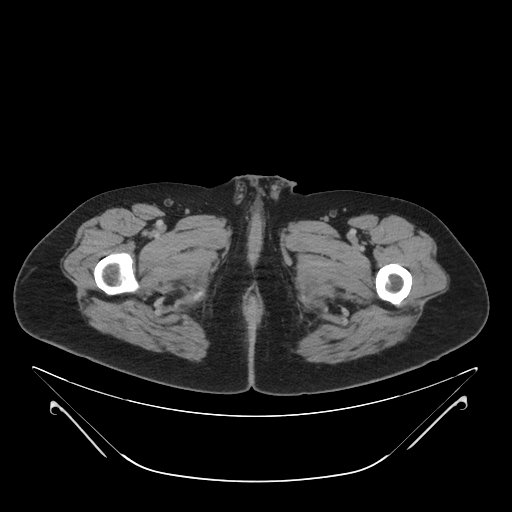
[im 26/79  soft-tissue]
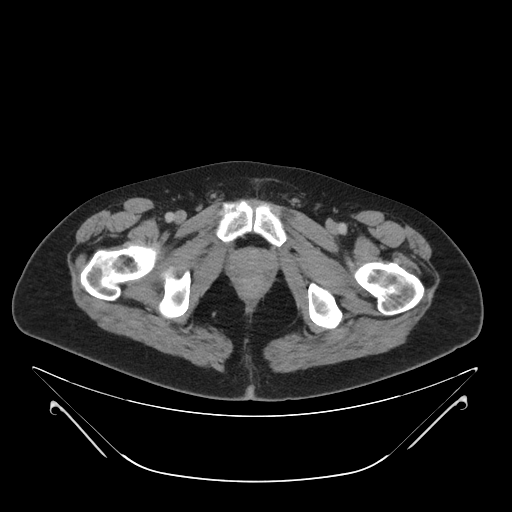
[im 36/79  soft-tissue]
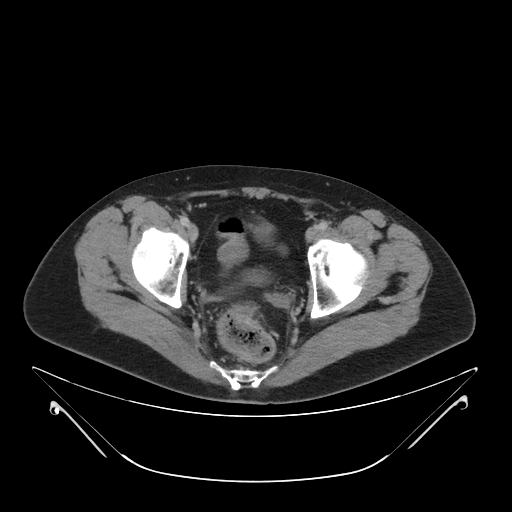
[im 43/79  soft-tissue]
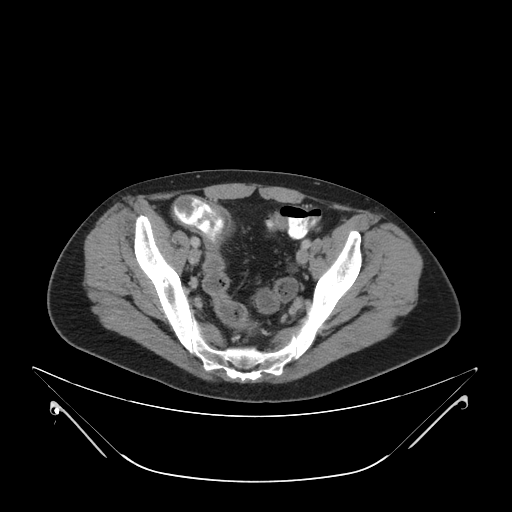
[im 53/79  soft-tissue]
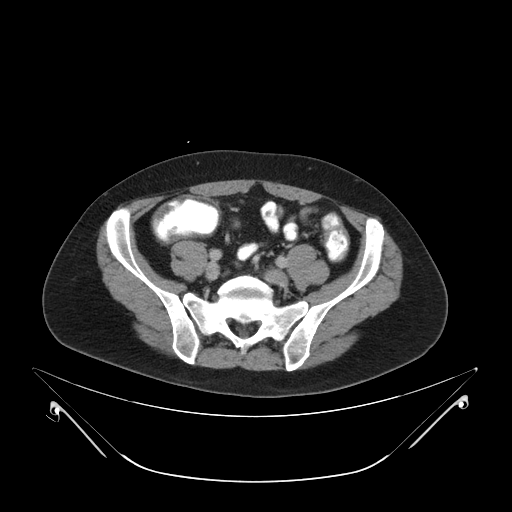
[im 63/79  soft-tissue]
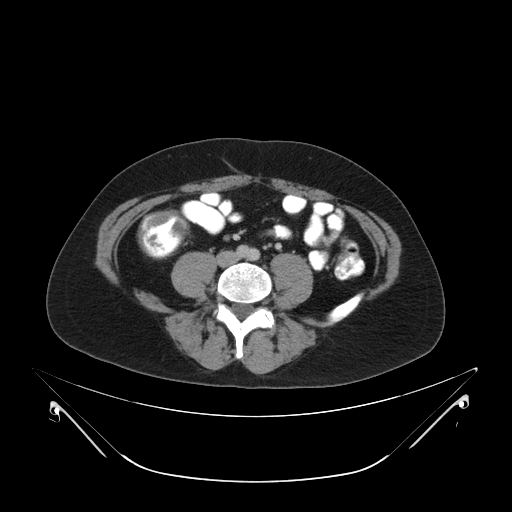
[im 73/79  soft-tissue]
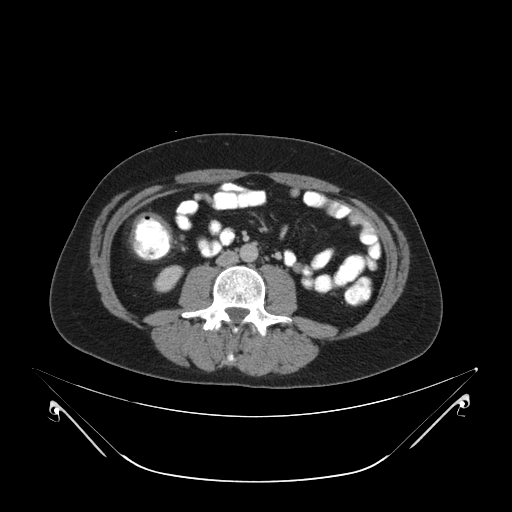

[Series 203: coronals, idose (2) · coronal · 0.45mm/px · 3 of 138 slices shown]
[im 46/138  soft-tissue]
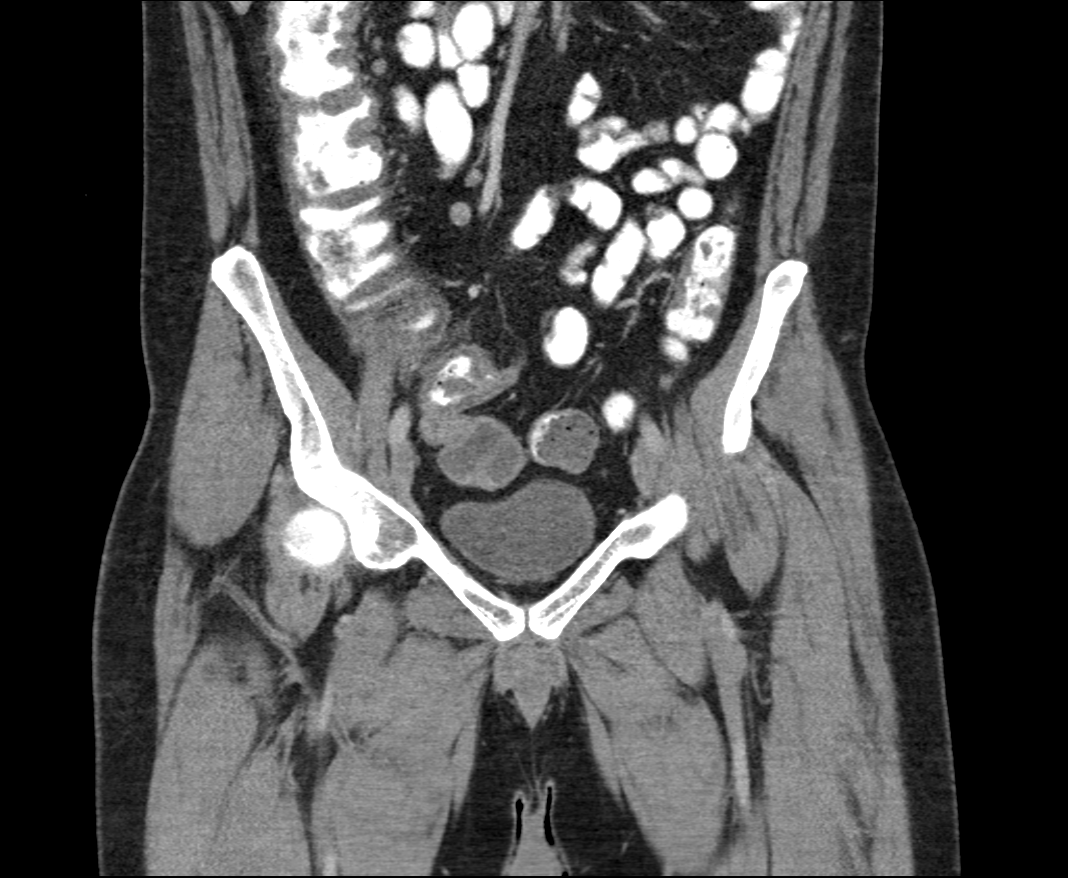
[im 61/138  soft-tissue]
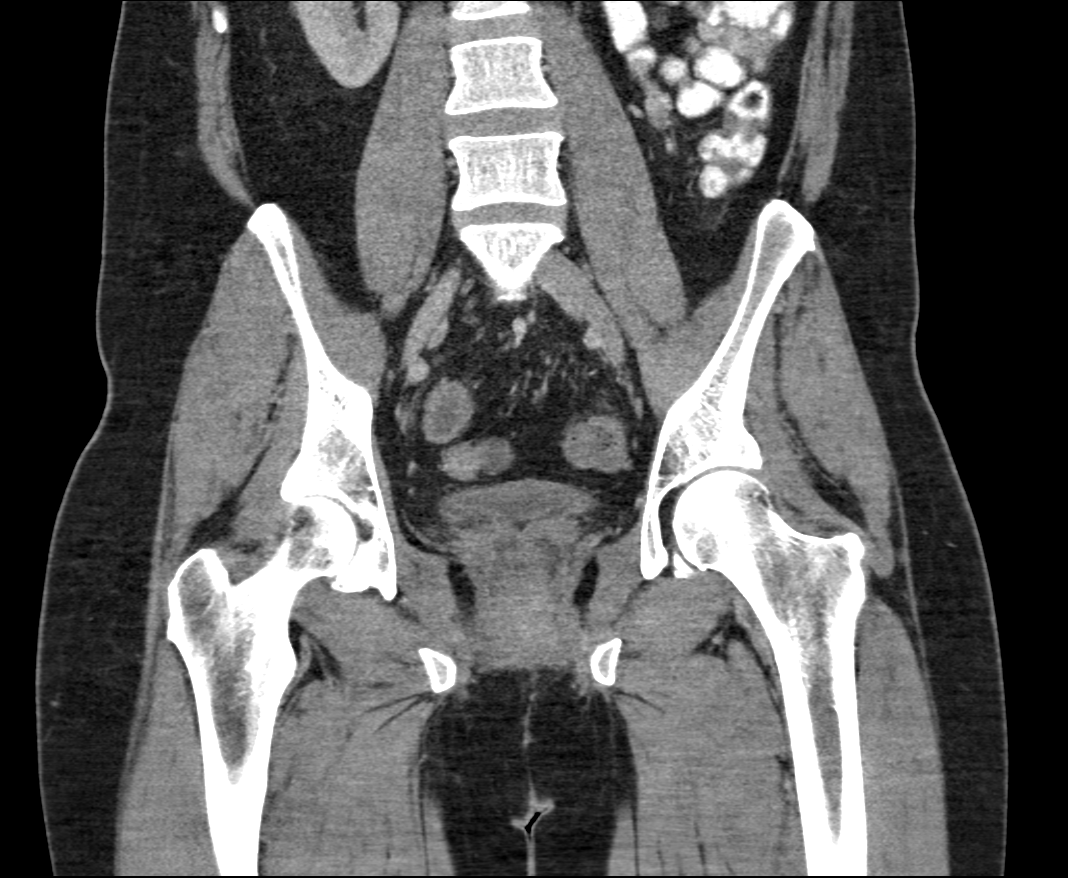
[im 77/138  soft-tissue]
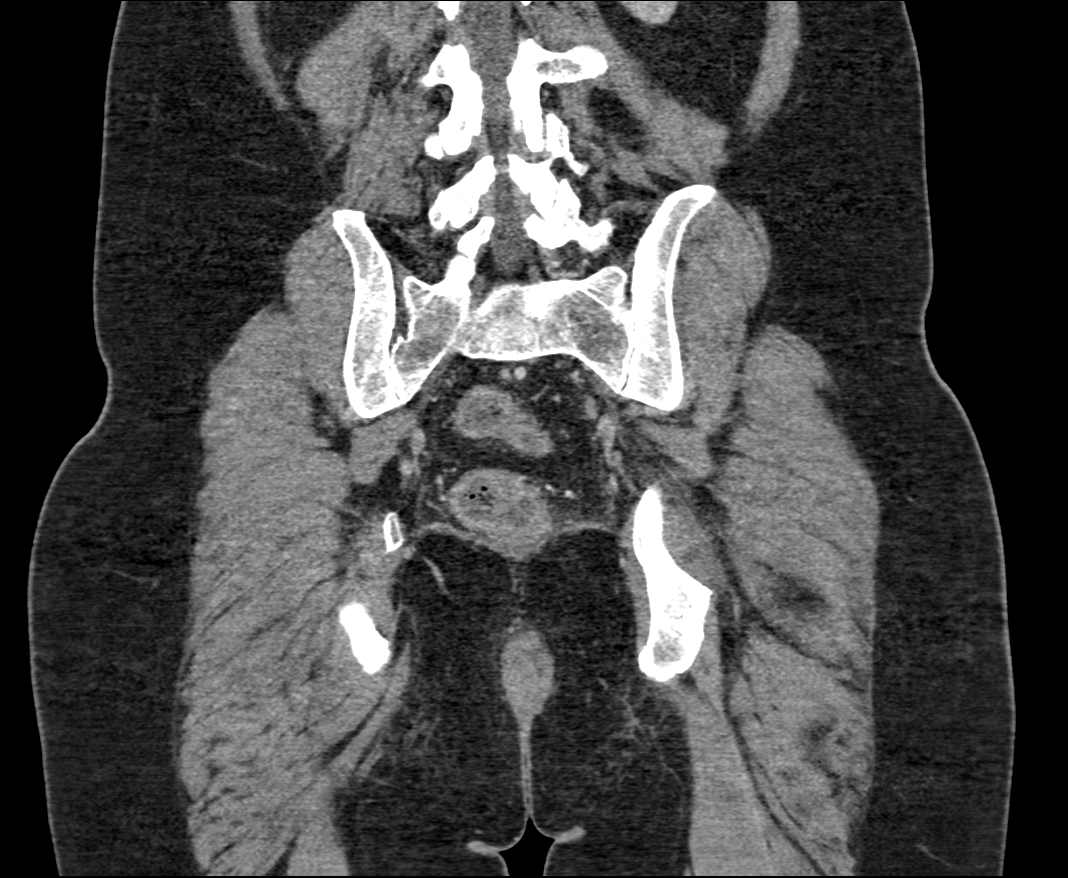

[11 of 46 positions shown; findings below may reference images not displayed]

FINDINGS: Urinary Tract:  The visualized portion is normal.

Bowel: The visualized colon and distal small bowel are normal. The
appendix is normal. There is no focal perirectal or perianal
abnormality.

Vascular/Lymphatic: No pathologically enlarged lymph nodes. No
significant vascular abnormality seen.

Reproductive:  Normal prostate and seminal vesicles.

Other:  None

Musculoskeletal: No lytic or blastic lesions.
IMPRESSION: No acute abnormality of the pelvis. Specifically, no perirectal or
perianal abscess.

## 2018-03-22 ENCOUNTER — Telehealth: Payer: Self-pay

## 2018-03-22 ENCOUNTER — Encounter: Payer: Self-pay | Admitting: Surgery

## 2018-03-22 DIAGNOSIS — K6289 Other specified diseases of anus and rectum: Secondary | ICD-10-CM | POA: Insufficient documentation

## 2018-03-22 NOTE — Telephone Encounter (Signed)
-----   Message from Yetta Flock, MD sent at 03/22/2018 10:17 AM EDT ----- Regarding: colonoscopy Hi [name], I got a call from [name] about this patient - I think he needs a full colonoscopy with more biopsies to assess for Crohn's disease. Can you call him and help book him for this to be done first available. I think I have openings Friday, or can add on a 730 case if needed. Thanks

## 2018-03-22 NOTE — Telephone Encounter (Signed)
Spoke to patient, he will discuss with his wife what would be a good time to schedule colonoscopy. He will call back later, as of now we have available 9/20 at 0800 or Oct dates.

## 2018-03-24 ENCOUNTER — Encounter: Payer: Self-pay | Admitting: Gastroenterology

## 2018-03-24 ENCOUNTER — Ambulatory Visit (AMBULATORY_SURGERY_CENTER): Payer: Self-pay | Admitting: *Deleted

## 2018-03-24 VITALS — Ht 73.5 in | Wt 185.0 lb

## 2018-03-24 DIAGNOSIS — R109 Unspecified abdominal pain: Secondary | ICD-10-CM

## 2018-03-24 DIAGNOSIS — R194 Change in bowel habit: Secondary | ICD-10-CM

## 2018-03-24 MED ORDER — NA SULFATE-K SULFATE-MG SULF 17.5-3.13-1.6 GM/177ML PO SOLN: 1.0000 | Freq: Once | ORAL | 0 refills | Status: AC

## 2018-03-24 NOTE — Progress Notes (Signed)
No egg or soy allergy known to patient  No issues with past sedation with any surgeries  or procedures, no intubation problems  No diet pills per patient No home 02 use per patient  No blood thinners per patient  Pt denies issues with constipation  No A fib or A flutter  EMMI video sent to pt's e mail -- On line Suprep $15 coupon top  Pt- in PV today

## 2018-04-05 ENCOUNTER — Encounter: Payer: Self-pay | Admitting: Gastroenterology

## 2018-04-05 ENCOUNTER — Ambulatory Visit (AMBULATORY_SURGERY_CENTER): Payer: Managed Care, Other (non HMO) | Admitting: Gastroenterology

## 2018-04-05 VITALS — BP 125/79 | HR 73 | Temp 98.6°F | Resp 12 | Ht 73.0 in | Wt 185.0 lb

## 2018-04-05 DIAGNOSIS — K529 Noninfective gastroenteritis and colitis, unspecified: Secondary | ICD-10-CM | POA: Diagnosis not present

## 2018-04-05 DIAGNOSIS — R194 Change in bowel habit: Secondary | ICD-10-CM

## 2018-04-05 DIAGNOSIS — K6289 Other specified diseases of anus and rectum: Secondary | ICD-10-CM | POA: Diagnosis not present

## 2018-04-05 MED ORDER — SODIUM CHLORIDE 0.9 % IV SOLN: 500.0000 mL | Freq: Once | INTRAVENOUS | Status: AC

## 2018-04-05 NOTE — Progress Notes (Signed)
Pt's states no medical or surgical changes since previsit or office visit. 

## 2018-04-05 NOTE — Progress Notes (Signed)
Called to room to assist during endoscopic procedure.  Patient ID and intended procedure confirmed with present staff. Received instructions for my participation in the procedure from the performing physician.  

## 2018-04-05 NOTE — Patient Instructions (Signed)
*   No Nonsteroidal anti inflammatory drugs* Tylenol only for pain*  YOU HAD AN ENDOSCOPIC PROCEDURE TODAY AT [facility]:   Refer to the procedure report that was given to you for any specific questions about what was found during the examination.  If the procedure report does not answer your questions, please call your gastroenterologist to clarify.  If you requested that your care partner not be given the details of your procedure findings, then the procedure report has been included in a sealed envelope for you to review at your convenience later.  YOU SHOULD EXPECT: Some feelings of bloating in the abdomen. Passage of more gas than usual.  Walking can help get rid of the air that was put into your GI tract during the procedure and reduce the bloating. If you had a lower endoscopy (such as a colonoscopy or flexible sigmoidoscopy) you may notice spotting of blood in your stool or on the toilet paper. If you underwent a bowel prep for your procedure, you may not have a normal bowel movement for a few days.  Please Note:  You might notice some irritation and congestion in your nose or some drainage.  This is from the oxygen used during your procedure.  There is no need for concern and it should clear up in a day or so.  SYMPTOMS TO REPORT IMMEDIATELY:   Following lower endoscopy (colonoscopy or flexible sigmoidoscopy):  Excessive amounts of blood in the stool  Significant tenderness or worsening of abdominal pains  Swelling of the abdomen that is new, acute  Fever of 100F or higher   For urgent or emergent issues, a gastroenterologist can be reached at any hour by calling [phone].   DIET:  We do recommend a small meal at first, but then you may proceed to your regular diet.  Drink plenty of fluids but you should avoid alcoholic beverages for 24 hours.  ACTIVITY:  You should plan to take it easy for the rest of today and you should NOT DRIVE or use heavy machinery until  tomorrow (because of the sedation medicines used during the test).    FOLLOW UP: Our staff will call the number listed on your records the next business day following your procedure to check on you and address any questions or concerns that you may have regarding the information given to you following your procedure. If we do not reach you, we will leave a message.  However, if you are feeling well and you are not experiencing any problems, there is no need to return our call.  We will assume that you have returned to your regular daily activities without incident.  If any biopsies were taken you will be contacted by phone or by letter within the next 1-3 weeks.  Please call us at [phone] if you have not heard about the biopsies in 3 weeks.    SIGNATURES/CONFIDENTIALITY: You and/or your care partner have signed paperwork which will be entered into your electronic medical record.  These signatures attest to the fact that that the information above on your After Visit Summary has been reviewed and is understood.  Full responsibility of the confidentiality of this discharge information lies with you and/or your care-partner.

## 2018-04-05 NOTE — Progress Notes (Signed)
Report to PACU, RN, vss, BBS= Clear.  

## 2018-04-05 NOTE — Op Note (Addendum)
[place] Patient Name: Hayden Bennett Procedure Date: 04/05/2018 9:11 AM MRN: [id number] Endoscopist: Remo Lipps P. Havery Moros , MD Age: 47 Referring MD:  Date of Birth: February 21, 1971 Gender: Male Account #: [id number] Procedure:                Colonoscopy Indications:              Change in bowel habits, Rectal pain - history of                            rectal ulceration that has improved with Flagyl in                            the past. Prior colonoscopy had ulcerations but                            path was not typical for Crohn's, interval flex sig                            last year showed interval healing after antibiotics Medicines:                Monitored Anesthesia Care Procedure:                Pre-Anesthesia Assessment:                           - Prior to the procedure, a History and Physical                            was performed, and patient medications and                            allergies were reviewed. The patient's tolerance of                            previous anesthesia was also reviewed. The risks                            and benefits of the procedure and the sedation                            options and risks were discussed with the patient.                            All questions were answered, and informed consent                            was obtained. Prior Anticoagulants: The patient has                            taken no previous anticoagulant or antiplatelet                            agents. ASA Grade Assessment: II - A patient with  mild systemic disease. After reviewing the risks                            and benefits, the patient was deemed in                            satisfactory condition to undergo the procedure.                           After obtaining informed consent, the colonoscope                            was passed under direct vision. Throughout the   procedure, the patient's blood pressure, pulse, and                            oxygen saturations were monitored continuously. The                            Model PCF-H190DL [id number]) scope was introduced                            through the anus and advanced to the the terminal                            ileum, with identification of the appendiceal                            orifice and IC valve. The colonoscopy was performed                            without difficulty. The patient tolerated the                            procedure well. The quality of the bowel                            preparation was mostly adequate, only fair in right                            colon. The terminal ileum, ileocecal valve,                            appendiceal orifice, and rectum were photographed. Scope In: 9:15:02 AM Scope Out: 9:35:33 AM Scope Withdrawal Time: 0 hours 14 minutes 49 seconds  Total Procedure Duration: 0 hours 20 minutes 31 seconds  Findings:                 The perianal exam findings include ulceration of                            the anal canal.                           The terminal ileum appeared normal.  Multiple large deep ulcers were found in the entire                            colon, highest burden in the right and transverse                            colon. Biopsies were taken with a cold forceps for                            histology.                           The mucosa aside from the ulcers was normal without                            other inflammatory changes, the exam was otherwise                            without abnormality. The prep was only fair in the                            right colon and cecum could not be completely                            cleared. Complications:            No immediate complications. Estimated blood loss:                            Minimal. Estimated Blood Loss:     Estimated blood loss was  minimal. Impression:               - Ulceration of the anal canal found on perianal                            exam.                           - The examined portion of the ileum was normal.                           - Multiple ulcers in the entire examined colon as                            outlined above. Biopsied.                           - The examination was otherwise normal.                           Overall, findings are most concerning for Crohn's                            disease of the colon. Recommendation:           - Patient has a contact number  available for                            emergencies. The signs and symptoms of potential                            delayed complications were discussed with the                            patient. Return to normal activities tomorrow.                            Written discharge instructions were provided to the                            patient.                           - Resume previous diet.                           - Continue present medications.                           - Await pathology results with further                            recommendations                           - NO NSAIDs Remo Lipps P. Teirra Carapia, MD 04/05/2018 9:43:10 AM This report has been signed electronically.

## 2018-04-06 ENCOUNTER — Telehealth: Payer: Self-pay

## 2018-04-06 NOTE — Telephone Encounter (Signed)
Called [phone] and left a messaged we tried to reach pt for a follow up call. maw

## 2018-04-06 NOTE — Telephone Encounter (Signed)
  Follow up Call-  Call back number 04/05/2018 02/22/2017 10/27/2016  Post procedure Call Back phone  # [phone] cell  Permission to leave phone message Yes Yes Yes  Some recent data might be hidden     Patient questions:   Do you have a fever, pain , or abdominal swelling? No. Pain Score  0 *  Have you tolerated food without any problems? Yes.    Have you been able to return to your normal activities? Yes.    Do you have any questions about your discharge instructions: Diet   No. Medications  No. Follow up visit  No.  Do you have questions or concerns about your Care? No.  Actions: * If pain score is 4 or above: No action needed, pain <4.  No problems noted per pt. maw

## 2018-04-14 ENCOUNTER — Other Ambulatory Visit: Payer: Self-pay

## 2018-04-14 ENCOUNTER — Other Ambulatory Visit: Payer: Self-pay | Admitting: Gastroenterology

## 2018-04-14 DIAGNOSIS — K50118 Crohn's disease of large intestine with other complication: Secondary | ICD-10-CM

## 2018-04-14 MED ORDER — INFLIXIMAB 100 MG IV SOLR: 5.0000 mg/kg | INTRAVENOUS | 9 refills | Status: DC

## 2018-04-15 ENCOUNTER — Other Ambulatory Visit: Payer: Self-pay

## 2018-04-15 DIAGNOSIS — K50118 Crohn's disease of large intestine with other complication: Secondary | ICD-10-CM

## 2018-04-19 ENCOUNTER — Other Ambulatory Visit: Payer: Managed Care, Other (non HMO)

## 2018-04-19 DIAGNOSIS — K50119 Crohn's disease of large intestine with unspecified complications: Secondary | ICD-10-CM

## 2018-04-19 DIAGNOSIS — K50118 Crohn's disease of large intestine with other complication: Secondary | ICD-10-CM

## 2018-04-20 ENCOUNTER — Telehealth: Payer: Self-pay | Admitting: Gastroenterology

## 2018-04-21 LAB — QUANTIFERON-TB GOLD PLUS
NIL: 0.03 [IU]/mL
QuantiFERON-TB Gold Plus: NEGATIVE
TB1-NIL: 0 [IU]/mL
TB2-NIL: 0 IU/mL

## 2018-04-21 LAB — HEPATITIS C ANTIBODY
Hepatitis C Ab: NONREACTIVE
SIGNAL TO CUT-OFF: 0.01 (ref <1.00)

## 2018-04-21 LAB — HEPATITIS B CORE ANTIBODY, TOTAL: Hep B Core Total Ab: NONREACTIVE

## 2018-04-21 LAB — HEPATITIS B SURFACE ANTIGEN: HEP B S AG: NONREACTIVE

## 2018-04-22 ENCOUNTER — Telehealth: Payer: Self-pay | Admitting: Gastroenterology

## 2018-04-22 NOTE — Telephone Encounter (Signed)
Patient advised of results, see result notes.

## 2018-04-22 NOTE — Telephone Encounter (Signed)
Pt returned your call and would like another call pls.

## 2018-04-25 ENCOUNTER — Ambulatory Visit (INDEPENDENT_AMBULATORY_CARE_PROVIDER_SITE_OTHER): Payer: Managed Care, Other (non HMO) | Admitting: Gastroenterology

## 2018-04-25 DIAGNOSIS — Z23 Encounter for immunization: Secondary | ICD-10-CM

## 2018-04-25 DIAGNOSIS — K50118 Crohn's disease of large intestine with other complication: Secondary | ICD-10-CM

## 2018-04-25 MED ORDER — PNEUMOCOCCAL 13-VAL CONJ VACC IM SUSP
0.5000 mL | Freq: Once | INTRAMUSCULAR | Status: AC
  Administered 2018-04-25: 0.5 mL via INTRAMUSCULAR

## 2018-05-05 ENCOUNTER — Telehealth: Payer: Self-pay | Admitting: Gastroenterology

## 2018-05-05 NOTE — Telephone Encounter (Signed)
Caren Griffins CMA from Dr. Kathlene November. (Rheumatologist) called requesting a copy of last CBC,CMP and quantiferon..Fax them at : [phone] attn July. Pt was seen there today.

## 2018-05-05 NOTE — Telephone Encounter (Signed)
Routed/faxed requested lab results to Dr. Kathlene November.

## 2018-07-15 NOTE — Telephone Encounter (Signed)
Done

## 2018-08-15 ENCOUNTER — Encounter: Payer: Self-pay | Admitting: Gastroenterology

## 2019-01-16 ENCOUNTER — Other Ambulatory Visit: Payer: Self-pay

## 2019-01-16 ENCOUNTER — Telehealth: Payer: Self-pay | Admitting: Gastroenterology

## 2019-01-16 DIAGNOSIS — K50118 Crohn's disease of large intestine with other complication: Secondary | ICD-10-CM

## 2019-01-16 MED ORDER — AMOXICILLIN-POT CLAVULANATE 875-125 MG PO TABS: 1.0000 | ORAL_TABLET | Freq: Two times a day (BID) | ORAL | 0 refills | Status: DC

## 2019-01-16 NOTE — Telephone Encounter (Signed)
Patient called said a fungal infection and he has bad sinus due to the remicade he does not have a PCP and would like to know if he can be referred to someone else by Dr. Havery Moros

## 2019-01-16 NOTE — Telephone Encounter (Signed)
Patient called and states he thinks he has a sinus infection. He had one about 8 yrs. Ago and it feels the same. States it has been coming on gradually over the last few months, since he has been on Remicade. He has no PCP and wanted to know if Dr. Havery Moros could refer him to someone. Please advise.

## 2019-01-16 NOTE — Telephone Encounter (Signed)
Up to him, if he is requesting an antifungal for a sinus infection that is unusual, he will need to see a PCP or someone with more experience for that. I would encourage him to take the antibiotics if he has a suspected infection especially in light of his immunosuppression.

## 2019-01-16 NOTE — Telephone Encounter (Signed)
Called patient and gave Dr. Doyne Keel recommendation

## 2019-01-16 NOTE — Telephone Encounter (Signed)
Sorry to hear this. If he thinks he has a sinus infection, in the setting of Remicade would have low threshold to give antibiotics. Would recommend Augmentin 879m BID for one week if you can give him that. Otherwise he is very overdue for clinic visit, if you can help coordinate. He is also due for basic labs - CBC and CMET if he can go to the lab. Thanks

## 2019-01-16 NOTE — Telephone Encounter (Signed)
Order sent to patient's pharmacy for Augmentin. Called patient and gave instructions for taking, he states when he had a sinus infx. before they gave him an anti-fungal, that he doesn't think this will work and he might not fill it. Order in [place] for CBC and CMET and scheduled office visit for 02/21/19 at 3:20pm. Patient aware of this and agreed to come in for labs and office visit.

## 2019-02-21 ENCOUNTER — Ambulatory Visit: Payer: Managed Care, Other (non HMO) | Admitting: Gastroenterology

## 2019-04-11 ENCOUNTER — Encounter: Payer: Self-pay | Admitting: Gastroenterology

## 2019-05-05 ENCOUNTER — Telehealth: Payer: Self-pay | Admitting: Gastroenterology

## 2019-05-05 NOTE — Telephone Encounter (Signed)
Thanks Customer service manager. Well, if Remicade worked for him and his last dose was in July, he should probably resume it as soon as possible to minimize loss of response or development of immungenicity. I am okay with him resuming it ASAP as long as he agrees to get labs and see me in the clinic if you can book him for that. I'm concerned if he is delayed in resuming his Remicade further, risk is loss of response moving forward. thanks

## 2019-05-05 NOTE — Telephone Encounter (Signed)
[name] from [facility]. Called and wanted to know if this patient is suppose to restart his Remicade or continue to hold it. I told her it was held in July of this year because patient felt he had a sinus infection. Dr. Havery Moros had suggested Augmentin, we ordered some labs and made an office visit for 02/21/19 with Dr. Havery Moros.( Since patient has  Not had an office visit with [place] since 03/03/18.)  Patient did not come in for the labs and cancelled his office visit. Let her know he would need to come in for an office visit with Dr. Havery Moros or app. for them to assess his current status

## 2019-05-05 NOTE — Telephone Encounter (Signed)
Left message for [facility] to please call me back, reference patient's Remicade

## 2019-05-08 ENCOUNTER — Telehealth: Payer: Self-pay

## 2019-05-08 NOTE — Telephone Encounter (Signed)
Called patient and left message to please call our office at [phone] to schedule an office visit with Dr. Havery Moros

## 2019-05-08 NOTE — Telephone Encounter (Signed)
Thanks can you also please book him a follow up with me in the office. Thanks

## 2019-05-08 NOTE — Telephone Encounter (Signed)
Spoke to [place] at [facility] this morning, and she has been trying to get a hold of patient to schedule a Remicade infusion and he has not called her back. She will keep trying

## 2019-09-09 ENCOUNTER — Ambulatory Visit: Payer: Managed Care, Other (non HMO) | Attending: Internal Medicine

## 2019-09-09 DIAGNOSIS — Z23 Encounter for immunization: Secondary | ICD-10-CM | POA: Insufficient documentation

## 2019-09-09 NOTE — Progress Notes (Signed)
   Covid-19 Vaccination Clinic  Name:  Hayden Bennett    MRN: [id number] DOB: 05/07/1971  09/09/2019  Mr. Hayden Bennett was observed post Covid-19 immunization for 15 minutes without incident. He was provided with Vaccine Information Sheet and instruction to access the V-Safe system.   Mr. Hayden Bennett was instructed to call 911 with any severe reactions post vaccine: Marland Kitchen Difficulty breathing  . Swelling of face and throat  . A fast heartbeat  . A bad rash all over body  . Dizziness and weakness   Immunizations Administered    Name Date Dose VIS Date Route   Pfizer COVID-19 Vaccine 09/09/2019  4:25 PM 0.3 mL 06/16/2019 Intramuscular   Manufacturer: [place]   Lot: [id number]   [place]: [address]-5

## 2019-10-10 ENCOUNTER — Ambulatory Visit: Payer: Managed Care, Other (non HMO) | Attending: Internal Medicine

## 2019-10-10 DIAGNOSIS — Z23 Encounter for immunization: Secondary | ICD-10-CM

## 2019-10-10 NOTE — Progress Notes (Signed)
   Covid-19 Vaccination Clinic  Name:  NEDIM OKI    MRN: [id number] DOB: [birth year]  10/10/2019  Mr. Morriss was observed post Covid-19 immunization for 15 minutes without incident. He was provided with Vaccine Information Sheet and instruction to access the V-Safe system.   Mr. Nodarse was instructed to call 911 with any severe reactions post vaccine: Marland Kitchen Difficulty breathing  . Swelling of face and throat  . A fast heartbeat  . A bad rash all over body  . Dizziness and weakness   Immunizations Administered    Name Date Dose VIS Date Route   Pfizer COVID-19 Vaccine 10/10/2019  1:43 PM 0.3 mL 06/16/2019 Intramuscular   Manufacturer: [employer]   Lot: [id number]   [place]: [address]-0

## 2023-12-01 ENCOUNTER — Telehealth: Payer: Self-pay | Admitting: Gastroenterology

## 2023-12-01 NOTE — Telephone Encounter (Signed)
 Inbound call from patient, would like to be tested for C diff prior to appointment with Marlin Simmonds, PA in July. He states he has been having diarrhea and no formed stools, he has been having these symptoms over a couple weeks. 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

## 2023-12-01 NOTE — Telephone Encounter (Signed)
 Patient was last seen in 2019; he has as history of Crohn's disease with complication and also has a history of C Diff in 2018. Patient has been scheduled to see Suzanna Erp, PA-C on 12/02/23. Patient verbalizes understanding.

## 2023-12-02 ENCOUNTER — Encounter: Payer: Self-pay | Admitting: Gastroenterology

## 2023-12-02 ENCOUNTER — Other Ambulatory Visit (INDEPENDENT_AMBULATORY_CARE_PROVIDER_SITE_OTHER)

## 2023-12-02 ENCOUNTER — Ambulatory Visit: Admitting: Gastroenterology

## 2023-12-02 ENCOUNTER — Ambulatory Visit
Admission: RE | Admit: 2023-12-02 | Discharge: 2023-12-02 | Disposition: A | Discharge status: Home / Self Care | Source: Ambulatory Visit | Attending: Gastroenterology

## 2023-12-02 ENCOUNTER — Ambulatory Visit (HOSPITAL_BASED_OUTPATIENT_CLINIC_OR_DEPARTMENT_OTHER)

## 2023-12-02 ENCOUNTER — Ambulatory Visit: Payer: Self-pay | Admitting: Gastroenterology

## 2023-12-02 VITALS — BP 94/64 | HR 92 | Ht 73.5 in | Wt 159.1 lb

## 2023-12-02 DIAGNOSIS — K6289 Other specified diseases of anus and rectum: Secondary | ICD-10-CM

## 2023-12-02 DIAGNOSIS — R634 Abnormal weight loss: Secondary | ICD-10-CM

## 2023-12-02 DIAGNOSIS — R109 Unspecified abdominal pain: Secondary | ICD-10-CM | POA: Diagnosis not present

## 2023-12-02 DIAGNOSIS — K50119 Crohn's disease of large intestine with unspecified complications: Secondary | ICD-10-CM

## 2023-12-02 DIAGNOSIS — R1030 Lower abdominal pain, unspecified: Secondary | ICD-10-CM

## 2023-12-02 DIAGNOSIS — R197 Diarrhea, unspecified: Secondary | ICD-10-CM

## 2023-12-02 DIAGNOSIS — Z8619 Personal history of other infectious and parasitic diseases: Secondary | ICD-10-CM

## 2023-12-02 LAB — CBC WITH DIFFERENTIAL/PLATELET
Basophils Absolute: 0.1 10*3/uL (ref 0.0–0.1)
Basophils Relative: 1.2 % (ref 0.0–3.0)
Eosinophils Absolute: 0.2 10*3/uL (ref 0.0–0.7)
Eosinophils Relative: 2.7 % (ref 0.0–5.0)
HCT: 35 % — ABNORMAL LOW (ref 39.0–52.0)
Hemoglobin: 11.8 g/dL — ABNORMAL LOW (ref 13.0–17.0)
Lymphocytes Relative: 14.3 % (ref 12.0–46.0)
Lymphs Abs: 1.1 10*3/uL (ref 0.7–4.0)
MCHC: 33.7 g/dL (ref 30.0–36.0)
MCV: 85.2 fl (ref 78.0–100.0)
Monocytes Absolute: 0.8 10*3/uL (ref 0.1–1.0)
Monocytes Relative: 9.6 % (ref 3.0–12.0)
Neutro Abs: 5.6 10*3/uL (ref 1.4–7.7)
Neutrophils Relative %: 72.2 % (ref 43.0–77.0)
Platelets: 451 10*3/uL — ABNORMAL HIGH (ref 150.0–400.0)
RBC: 4.11 Mil/uL — ABNORMAL LOW (ref 4.22–5.81)
RDW: 13.5 % (ref 11.5–15.5)
WBC: 7.8 10*3/uL (ref 4.0–10.5)

## 2023-12-02 LAB — COMPREHENSIVE METABOLIC PANEL WITH GFR
ALT: 12 U/L (ref 0–53)
AST: 12 U/L (ref 0–37)
Albumin: 3.7 g/dL (ref 3.5–5.2)
Alkaline Phosphatase: 55 U/L (ref 39–117)
BUN: 8 mg/dL (ref 6–23)
CO2: 28 meq/L (ref 19–32)
Calcium: 8.8 mg/dL (ref 8.4–10.5)
Chloride: 102 meq/L (ref 96–112)
Creatinine, Ser: 0.84 mg/dL (ref 0.40–1.50)
GFR: 99.21 mL/min (ref >60.00)
Glucose, Bld: 104 mg/dL — ABNORMAL HIGH (ref 70–99)
Potassium: 4.2 meq/L (ref 3.5–5.1)
Sodium: 137 meq/L (ref 135–145)
Total Bilirubin: 0.4 mg/dL (ref 0.2–1.2)
Total Protein: 6.9 g/dL (ref 6.0–8.3)

## 2023-12-02 LAB — SEDIMENTATION RATE: Sed Rate: 38 mm/h — ABNORMAL HIGH (ref 0–20)

## 2023-12-02 LAB — HIGH SENSITIVITY CRP: CRP, High Sensitivity: 83.37 mg/L — ABNORMAL HIGH (ref 0.000–5.000)

## 2023-12-02 MED ORDER — IOPAMIDOL (ISOVUE-300) INJECTION 61%
100.0000 mL | Freq: Once | INTRAVENOUS | Status: AC | PRN
  Administered 2023-12-02: 100 mL via INTRAVENOUS

## 2023-12-02 NOTE — Progress Notes (Signed)
 Chief Complaint: Crohn's Primary GI MD: Dr. General Kenner  HPI: 53 year old male with history of Crohn's presents for possible flare  Patient is known to Dr. General Kenner for his long history of anal pain.  02/22/2017 flex sigmoidoscopy showed an anal fissure/ulceration versus fistulous tract which were thought to be causing his pain.  He was started on Flagyl  500 mg 3 times daily for 2 weeks and switched from Nitroglycerin to Diltiazem /lidocaine  ointment.  He also had an MRI of the pelvis which did not show a fistulous track at that time.  It was recommended that if patient continued with pain then he may warrant surgical evaluation.    02/09/2017 saw Dr. 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  Also discussed recurrent rectal pain and history of anal fissure and rectal ulceration.    02/14/2018 called our office with a complaint of loose, not watery stools and intermittent abdominal pain as well as severe rectal pain after bowel movements.  At that time he was told to use Diltiazem  2% gel with lidocaine .    03/01/2018 C. difficile PCR test was negative.    03/03/2018 seen by Reginal Capra PA-C for rectal pain, generalized abdominal pain, and change in bowel habits with rectal exam showing concern for fistula.  Patient was referred to [facility] and this was not pursued    04/2018 colonoscopy consistent with Crohn's and put on remicade  with adequate response until discontinued in 2020 secondary to pandemic and has not been on therapy since  IBD history: Diagnosed in 2018/2019 Surgical history: None  Current medications: none Prior medications: Remicade  Last colonoscopy: 2019, crohn's colitis with perianal involvement  -------------------TODAY------------------------------------------ Discussed the use of AI scribe software for clinical note transcription with the patient, who gave verbal consent to proceed.  History of Present  Illness Diagnosed with Crohn's disease in 2019, he initially managed his condition with Remicade  until 2020, when he discontinued it due to the COVID-19 pandemic. Since then, he has relied on dietary changes to manage his symptoms.  Two months ago, he experienced food poisoning after consuming packaged [place] food, which led to persistent gastrointestinal symptoms. He describes ongoing 'gurgling indigestion' and discomfort that progresses from his stomach to his intestines.  His bowel movements have become progressively loose, resulting in diarrhea. Initially, Pepto Bismol and probiotics provided temporary relief, but in the last three weeks, his symptoms have worsened, with continuous diarrhea occurring every two to three hours with urgency.  He reports rectal pain, similar to the ulcer he had when first diagnosed with Crohn's but much less severe. He experienced one instance of bright red blood in his stool while using Pepto Bismol.  He has lost approximately 30 pounds over the last six months, with a noted decrease in appetite and difficulty eating due to discomfort. He also reports back discomfort, described as 'stiffness' and 'discomfort' rather than pain.  He moved to [place]  to care for his father with dementia and his mother, who is hospitalized, which has increased his stress levels and impacted his ability to manage his health.    PREVIOUS GI WORKUP   Colonoscopy 04/2018 - Ulceration of the anal canal found on perianal exam.  - The examined portion of the ileum was normal.  - Multiple ulcers in the entire examined colon as outlined above. Biopsied. - Biopsies consistent with Crohn's disease  Past Medical History:  Diagnosis Date   Allergy    year around sinus issues  Anxiety    C. difficile colitis    Depression    GERD (gastroesophageal reflux disease)    no meds now   Hypertension    Sleep apnea    does not wear a c-pap   Thyroid disease     Past Surgical History:   Procedure Laterality Date   COLONOSCOPY     SIGMOIDOSCOPY     WISDOM TOOTH EXTRACTION      Current Outpatient Medications  Medication Sig Dispense Refill   amoxicillin -clavulanate (AUGMENTIN ) 875-125 MG tablet Take 1 tablet by mouth 2 (two) times daily. 14 tablet 0   cholecalciferol (VITAMIN D) 1000 units tablet Take by mouth.     diltiazem  2 % GEL [place] 2% Diltiazem  gel with 5% lidocaine   Apply per rectum three times daily 45 g 1   ferrous sulfate 325 (65 FE) MG tablet Take 75 mg by mouth daily.     inFLIXimab  (REMICADE ) 100 MG injection Inject 400 mg into the vein every 8 (eight) weeks. Patient will need starter dose starting at week 0,2,6 then every 8 weeks. 1 each 9   losartan  (COZAAR ) 100 MG tablet Take 1 tablet by mouth daily.     MAGNESIUM PO Take by mouth.     NP THYROID 90 MG tablet Take 90 mg by mouth daily.  2   oxyCODONE -acetaminophen  (ROXICET) 5-325 MG tablet Take 1 tablet by mouth every 6 (six) hours as needed for severe pain. (Patient not taking: Reported on 04/05/2018) 20 tablet 0   vitamin k 100 MCG tablet Take 100 mcg by mouth daily.     zinc gluconate 50 MG tablet Take 50 mg by mouth daily.     Current Facility-Administered Medications  Medication Dose Route Frequency Provider Last Rate Last Admin   0.9 %  sodium chloride  infusion  500 mL Intravenous Continuous Armbruster, Lendon Queen, MD       0.9 %  sodium chloride  infusion  500 mL Intravenous Once Armbruster, Lendon Queen, MD        Allergies as of 12/02/2023 - Review Complete 04/05/2018  Allergen Reaction Noted   Vancomycin  Other (See Comments) 03/03/2018   Ciprofloxacin Swelling 10/02/2016    Family History  Problem Relation Age of Onset   Hypertension Mother    Thyroid disease Mother    Hypertension Father    Colon cancer Neg Hx    Esophageal cancer Neg Hx    Pancreatic cancer Neg Hx    Prostate cancer Neg Hx    Rectal cancer Neg Hx    Stomach cancer Neg Hx    Colon polyps Neg Hx     Social  History   Socioeconomic History   Marital status: Married    Spouse name: Not on file   Number of children: Not on file   Years of education: Not on file   Highest education level: Not on file  Occupational History   Not on file  Tobacco Use   Smoking status: Former    Current packs/day: 0.00    Types: Cigarettes    Quit date: 11/13/1997    Years since quitting: 26.0   Smokeless tobacco: Never  Vaping Use   Vaping status: Never Used  Substance and Sexual Activity   Alcohol use: No    Comment: none since Jan 2018   Drug use: No   Sexual activity: Not on file  Other Topics Concern   Not on file  Social History Narrative   Not on file   Social  Drivers of Corporate investment banker Strain: Not on file  Food Insecurity: Not on file  Transportation Needs: Not on file  Physical Activity: Not on file  Stress: Not on file  Social Connections: Unknown (11/14/2021)   Received from [facility]   Social Network    Social Network: Not on file  Intimate Partner Violence: Unknown (10/06/2021)   Received from [facility]   HITS    Physically Hurt: Not on file    Insult or Talk Down To: Not on file    Threaten Physical Harm: Not on file    Scream or Curse: Not on file    Review of Systems:    Constitutional: No weight loss, fever, chills, weakness or fatigue HEENT: Eyes: No change in vision               Ears, Nose, Throat:  No change in hearing or congestion Skin: No rash or itching Cardiovascular: No chest pain, chest pressure or palpitations   Respiratory: No SOB or cough Gastrointestinal: See HPI and otherwise negative Genitourinary: No dysuria or change in urinary frequency Neurological: No headache, dizziness or syncope Musculoskeletal: No new muscle or joint pain Hematologic: No bleeding or bruising Psychiatric: No history of depression or anxiety    Physical Exam:  Vital signs: There were no vitals taken for this visit.  Constitutional: NAD, alert and  cooperative Head:  Normocephalic and atraumatic. Eyes:   PEERL, EOMI. No icterus. Conjunctiva pink. Respiratory: Respirations even and unlabored. Lungs clear to auscultation bilaterally.   No wheezes, crackles, or rhonchi.  Cardiovascular:  Regular rate and rhythm. No peripheral edema, cyanosis or pallor.  Gastrointestinal:  Soft, nondistended, nontender. No rebound or guarding. Normal bowel sounds. No appreciable masses or hepatomegaly. Rectal:  Brooke CMA chaperone. No external hemorrhoids, erythema, or fissures. DRE tolerated well. Diatherix performed. Declined anoscopy. Internal hemorrhoid on DRE. No bleeding. No obvious fistulous track/ulceration Msk:  Symmetrical without gross deformities. Without edema, no deformity or joint abnormality.  Neurologic:  Alert and  oriented x4;  grossly normal neurologically.  Skin:   Dry and intact without significant lesions or rashes. Psychiatric: Oriented to person, place and time. Demonstrates good judgement and reason without abnormal affect or behaviors.   RELEVANT LABS AND IMAGING: CBC    Component Value Date/Time   WBC 9.5 03/03/2018 1534   RBC 4.16 (L) 03/03/2018 1534   HGB 13.2 03/03/2018 1534   HCT 38.3 (L) 03/03/2018 1534   PLT 381.0 03/03/2018 1534   MCV 92.0 03/03/2018 1534   MCH 32.3 10/02/2016 1016   MCHC 34.5 03/03/2018 1534   RDW 12.4 03/03/2018 1534   LYMPHSABS 1.3 03/03/2018 1534   MONOABS 0.7 03/03/2018 1534   EOSABS 0.3 03/03/2018 1534   BASOSABS 0.1 03/03/2018 1534    CMP     Component Value Date/Time   NA 138 03/03/2018 1534   K 3.8 03/03/2018 1534   CL 103 03/03/2018 1534   CO2 30 03/03/2018 1534   GLUCOSE 112 (H) 03/03/2018 1534   BUN 10 03/03/2018 1534   CREATININE 0.99 03/03/2018 1534   CALCIUM 9.0 03/03/2018 1534   PROT 7.2 03/03/2018 1534   ALBUMIN 3.8 03/03/2018 1534   AST 15 03/03/2018 1534   ALT 14 03/03/2018 1534   ALKPHOS 76 03/03/2018 1534   BILITOT 0.4 03/03/2018 1534   GFRNONAA >60 10/02/2016  1016   GFRAA >60 10/02/2016 1016     Assessment/Plan:   Crohn's disease Predominantly colonic disease with perianal manifestations.  Previous history of possible fistula and referred to [facility] in 2019 which was not pursued. Previous imaging did not show fistula, but he has not been seen in 5 years.  Colonoscopy 2019 with ulcers in the anal canal and throughout entire colon with biopsies consistent with Crohn's disease.   Responded well to few doses of remicade  before stopping due to the pandemic. Has not been on maintenance therapy since 2020. Loose stools every 2-3 hours and abdominal pain x 2 months with rectal pain associated with bowel movements. Flare versus infection. Unable to do infusion therapy due to moving to [facility] and taking care of elderly family, would like injectable.  Discussed we need to rule out infection prior to considering flare. If flare, would recommend prednisone taper and getting him set up to be on maintenance therapy. May need to consider repeat colonoscopy prior to starting maintenance therapy. Discussed he is likely a good candidate for humira versus IL-23 but will discuss further with Dr. General Kenner his recommendations for this patient with colonic crohn's with perianal involvement and possible fistulatizing disease  Plan -- CBC/CMP, CRP/ESR -- TB Gold, Hep B surface antigen and antibody -- Diatherix C diff and pathogen panel -- Fecal calprotectin -- if negative infectious workup, recommend prednisone taper -- discuss with Dr. General Kenner injectable maintenance therapy for patient with Crohn's colitis with perianal involvement -- Patient is instructed to avoid anti-inflammatories -- Patient reminded to remind up-to-date on influenza, pneumococcal and COVID-19 vaccinations, shingles. -- DEXA suggested obtaining baseline if previous history of recurrent corticosteroid use. -- Skin exam: Recommend annual skin exams if patient is on immunosuppression for evaluation  of nonmelanoma skin cancers. -- Eye exam as needed.  Weight loss 30lb weight loss over 6 months secondary to stress and decreased caloric intake from GI symptoms. Likely from his hesitancy to eat to not exacerbate his symptoms, but will need imaging for further evaluation -- CTAP with contrast  History of C. Difficile History of C. difficile 2018   This visit required 70 minutes of patient care (this includes precharting, chart review, review of results, face-to-face time used for counseling as well as treatment plan and follow-up. The patient was provided an opportunity to ask questions and all were answered. The patient agreed with the plan and demonstrated an understanding of the instructions.   Suzanna Erp, PA-C [facility] 12/02/2023, 9:20 AM  Cc: No ref. provider found

## 2023-12-02 NOTE — Patient Instructions (Addendum)
 Your provider has requested that you go to the basement level for lab work before leaving today. Press "B" on the elevator. The lab is located at the first door on the left as you exit the elevator.  You have been scheduled for a CT scan of the abdomen and pelvis at [facility], 1st floor Radiology. You are scheduled on 12/02/23 at 11:15 am. You should arrive 15 minutes prior to your appointment time for registration.  You may take any medications as prescribed with a small amount of water, if necessary. If you take any of the following medications: METFORMIN, GLUCOPHAGE, GLUCOVANCE, AVANDAMET, RIOMET, FORTAMET, ACTOPLUS MET, JANUMET, GLUMETZA or METAGLIP, you MAY be asked to HOLD this medication 48 hours AFTER the exam.    [facility]  [address], [place], [place], [address]      The purpose of you drinking the oral contrast is to aid in the visualization of your intestinal tract. The contrast solution may cause some diarrhea. Depending on your individual set of symptoms, you may also receive an intravenous injection of x-ray contrast/dye. Plan on being at [facility] for 45 minutes or longer, depending on the type of exam you are having performed.   If you have any questions regarding your exam or if you need to reschedule, you may call [facility] at [phone] between the hours of 8:00 am and 5:00 pm, Monday-Friday.   Your provider has ordered "Diatherix" stool testing for you. You have received a kit from our office today containing all necessary supplies to complete this test. Please carefully read the stool collection instructions provided in the kit before opening the accompanying materials. In addition, be sure there is a label providing your full name and date of birth on the "puritan opti-swab" tube that is supplied in the kit (if you do not see a label with this information on your test tube, please make us  aware before test collection!). After completing the  test, you should secure the purtian tube into the specimen biohazard bag. The [facility] E-Req sheet (including date and time of specimen collection) should be placed into the outside pocket of the specimen biohazard bag and returned to the [facility] lab (basement floor of [facility]) within 3 days of collection. Please make sure to give the specimen to a staff member at the lab. DO NOT leave the specimen on the counter.   If the specimen date and time (can be found in the upper right boxed portion of the sheet) are not filled out on the E-Req sheet, the test will NOT be performed.   _______________________________________________________  If your blood pressure at your visit was 140/90 or greater, please contact your primary care physician to follow up on this.  _______________________________________________________  If you are age 54 or older, your body mass index should be between 23-30. Your Body mass index is 20.71 kg/m. If this is out of the aforementioned range listed, please consider follow up with your Primary Care Provider.  If you are age 42 or younger, your body mass index should be between 19-25. Your Body mass index is 20.71 kg/m. If this is out of the aformentioned range listed, please consider follow up with your Primary Care Provider.   ________________________________________________________  The [facility] providers would like to encourage you to use MYCHART to communicate with providers for non-urgent requests or questions.  Due to long hold times on the telephone, sending your provider a message by [facility]  may be a faster and more efficient way to get a response.  Please allow 48 business hours for a response.  Please remember that this is for non-urgent requests.  _______________________________________________________  Due to recent changes in healthcare laws, you may see the results of your imaging and laboratory studies on MyChart before  your provider has had a chance to review them.  We understand that in some cases there may be results that are confusing or concerning to you. Not all laboratory results come back in the same time frame and the provider may be waiting for multiple results in order to interpret others.  Please give us  48 hours in order for your provider to thoroughly review all the results before contacting the office for clarification of your results.   Thank you for entrusting me with your care and choosing [facility].  Bayley Luan Rumpf, PA-C

## 2023-12-04 NOTE — Progress Notes (Signed)
 Agree with assessment and plan as outlined.  I see that he has had quantiferon gold and hep B testing. He does need a hep B core antibody, not surface antibody, and will need to go back to the lab for that if you can relay to office staff.  Given his history of perianal disease and colonic Crohn's, I'd recommend aggressive biologic therapy. If he does not want infusions, I think he'd be a candidate for Rinvoq if he wanted an oral regimen, or an injectable like Skyrizi. On most of the injectables they do require a few IV infusions initially and then transition to home injections. Easiest route of modality for him would be Rinvoq. Side effects of these include immunosuppression namely. I don't see that he has any history of blood clots or heart disease so is a candidate to use Rinvoq. If he wanted to pursue that we would need to get insurance approval and his insurance company may dictate what his options are. I'd like to await his stool testing and pursue one of these biologic options, start therapy and repeat colonoscopy in 6 months or so after starting treatment. Can you let me know what he thinks? Thanks

## 2023-12-05 LAB — QUANTIFERON-TB GOLD PLUS
Mitogen-NIL: 5.51 [IU]/mL
NIL: 0.02 [IU]/mL
QuantiFERON-TB Gold Plus: NEGATIVE
TB1-NIL: 0.01 [IU]/mL
TB2-NIL: 0 [IU]/mL

## 2023-12-05 LAB — HEPATITIS B SURFACE ANTIBODY,QUALITATIVE: Hep B S Ab: NONREACTIVE

## 2023-12-05 LAB — HEPATITIS B SURFACE ANTIGEN: Hepatitis B Surface Ag: NONREACTIVE

## 2023-12-07 ENCOUNTER — Telehealth: Payer: Self-pay | Admitting: Gastroenterology

## 2023-12-07 ENCOUNTER — Other Ambulatory Visit

## 2023-12-07 DIAGNOSIS — R634 Abnormal weight loss: Secondary | ICD-10-CM

## 2023-12-07 DIAGNOSIS — K50119 Crohn's disease of large intestine with unspecified complications: Secondary | ICD-10-CM

## 2023-12-07 DIAGNOSIS — R197 Diarrhea, unspecified: Secondary | ICD-10-CM

## 2023-12-07 DIAGNOSIS — R1030 Lower abdominal pain, unspecified: Secondary | ICD-10-CM

## 2023-12-07 DIAGNOSIS — K6289 Other specified diseases of anus and rectum: Secondary | ICD-10-CM

## 2023-12-07 MED ORDER — PREDNISONE 10 MG PO TABS: ORAL_TABLET | ORAL | 0 refills | Status: DC

## 2023-12-07 NOTE — Telephone Encounter (Signed)
 Left message for patient to call back

## 2023-12-07 NOTE — Addendum Note (Signed)
 Addended by: Glennette Lanius on: 12/07/2023 02:00 PM   Modules accepted: Orders

## 2023-12-07 NOTE — Telephone Encounter (Signed)
 Diatherix came back negative for infection  We can start patient on prednisone taper to help with his flare. Please send in prednisone 40mg  x 1 week, 30mg  x 1 week, 20mg  x 1 week and 10 mg x 1 week.   Please get him set up to get hep b core antibody lab (can set up in [place] if needed) so we can start maintenance therapy.  Also, Dr. General Kenner recommended Rinqvoq for him to start. This would be the easiest route of modality for him.   Side effects of these include immunosuppression mainly. Please see if this is an option to get going with his insurance or if his insurance dictates certain avenues. He will need colonoscopy 6 months after treatment start.

## 2023-12-07 NOTE — Telephone Encounter (Signed)
 Spoke to patient to advise of negative GI infection. Advised we will start him on prednisone taper to hopefully get his symptoms calmed down temporarily. Also advised that we would like to start him on Rinvoq for further capture of his Crohns disease. Advised that this will be dependent on insurance coverage and they may require us  to go a different route. Patient is agreeable to this but says when he took Remicade  years ago, he had some wounds that were difficult to heal. He currently has issues with his sinuses  (has for the last year) and says he does not want to cause delayed healing for that if he starts immunosuppresant therapy.  Patient is advised we also need him to come to our lab for one additional hepatitis test as required before we can start immunosuppression. Patient already plans on dropping off stool study today so he will have this completed at our lab.  Dr Armbruster/Bayley- Please give full sig for rinvoq and I will send to pharmacy.

## 2023-12-08 NOTE — Telephone Encounter (Signed)
 I have left a message for patient to call back. I suspect he would not be able to come today as he lives in [place], [place] and just came here yesterday for lab work and to drop off stool studies and mentioned that it is often difficult to get out of work to come to [employer].  Regarding prednisone, I have already sent prescription and advised patient on the prednisone 40 mg x 1 week, 30 mg x 1 week, 20 mg x 1 week, 10 mg x 1 week as previously recommended.

## 2023-12-08 NOTE — Telephone Encounter (Signed)
 Thank you Dottie! I appreciate you working him into the schedule

## 2023-12-08 NOTE — Telephone Encounter (Signed)
 Patient called back. He will come to see Dr General Kenner tomorrow, 12/09/23 at 4 pm to further discuss IBD plan of care.

## 2023-12-08 NOTE — Telephone Encounter (Signed)
 Inbound call from patient returning phone call. Requesting a call back. Please advise, thank you.

## 2023-12-08 NOTE — Telephone Encounter (Signed)
 Hayden Bennett / Versa Gore - he has a lot of good questions about starting therapy, there is a lot to speak about and options to discuss with him, as he has several options. I know he was just recently in in the office, but I do have an opening at 350 PM today, not sure if he would be willing to come in to discuss this in person for a few minutes to best address his questions. If he cannot come in today let me know. I need to clarify his concerns, answer his questions, and the ultimately submit a request to his insurance for coverage to see what is covered. I do agree with prednisone in the interim. Would do 40mg  / day for 2 weeks and then slow taper by 5mg  / week until done. If we do start biologic therapy in the interim can expedite his taper. Thanks

## 2023-12-09 ENCOUNTER — Encounter: Payer: Self-pay | Admitting: Gastroenterology

## 2023-12-09 ENCOUNTER — Telehealth: Payer: Self-pay | Admitting: *Deleted

## 2023-12-09 ENCOUNTER — Ambulatory Visit (INDEPENDENT_AMBULATORY_CARE_PROVIDER_SITE_OTHER): Admitting: Gastroenterology

## 2023-12-09 VITALS — BP 126/70 | HR 93 | Ht 73.5 in | Wt 159.0 lb

## 2023-12-09 DIAGNOSIS — Z23 Encounter for immunization: Secondary | ICD-10-CM | POA: Diagnosis not present

## 2023-12-09 DIAGNOSIS — Z5181 Encounter for therapeutic drug level monitoring: Secondary | ICD-10-CM

## 2023-12-09 DIAGNOSIS — Z79899 Other long term (current) drug therapy: Secondary | ICD-10-CM

## 2023-12-09 DIAGNOSIS — K50119 Crohn's disease of large intestine with unspecified complications: Secondary | ICD-10-CM | POA: Diagnosis not present

## 2023-12-09 LAB — CALPROTECTIN, FECAL: Calprotectin, Fecal: 4460 ug/g — ABNORMAL HIGH (ref 0–120)

## 2023-12-09 MED ORDER — PREDNISONE 10 MG PO TABS: ORAL_TABLET | ORAL | 0 refills | Status: DC

## 2023-12-09 MED ORDER — ZOSTER VAC RECOMB ADJUVANTED 50 MCG/0.5ML IM SUSR: 0.5000 mL | Freq: Once | INTRAMUSCULAR | 0 refills | Status: AC

## 2023-12-09 NOTE — Progress Notes (Signed)
 HPI :  53 year old male with colonic Crohn's disease here for follow-up visit.  IBD history: Diagnosed in 2018/2019 Surgical history: None  Current medications: none Prior medications: Remicade  Last colonoscopy: 2019, crohn's colitis with perianal involvement   See recent visit for full details of his case.  He was initially treated with Remicade  at time of diagnosis and responded very well.  Unfortunately within a year of starting it he stopped it due to the pandemic.  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  He then since has been taking care of his mother who is also quite ill, and had been managing his Crohn's with diet and exercise.  He states he did given up all alcohol and was eating much healthier and for a few years after stopping his Remicade  he generally was doing pretty well.  Unfortunately over the past 6 months he had some recurrent symptoms of nearly altered bowel habits with loose stools and abdominal discomfort.  Roughly 2 months ago things got much worse, he thought he had food poisoning but symptoms never resolved and essentially persisted until he sought evaluation.  He had a CT scan as outlined with significant colonic inflammation, fecal calprotectin in the 4000's.  He was recently started on prednisone by Suzanna Erp and is back to discuss biologic therapy with me today.  Recall he also has a history of C. difficile remotely.  He denies any rectal pain at this time, previously did have some of that.  He just started taking prednisone 40 mg daily today.  He understands some of his options for Crohn's disease and we discussed all with him at length today.  His preference is to avoid any infusion therapy if possible given he cannot leave his mother alone and tries to minimize his medical appointments.  Of note he got a dose of the Shingrix vaccine about a year  ago but did not follow-up for his second dose.  His last Pneumovax was PCV13 in 2019.  He denies any history of blood clots.  Denies any cardiopulmonary symptoms.  Denies any history of heart disease     PREVIOUS GI WORKUP    Colonoscopy 04/2018 - Ulceration of the anal canal found on perianal exam.  - The examined portion of the ileum was normal.  - Multiple ulcers in the entire examined colon as outlined above. Biopsied. - Biopsies consistent with Crohn's disease     Calprotectin, Fecal 4,460 High    TB testing negative Hep B surface antigen negative hep B core AB negative   CT abdomen / pelvis 12/02/23: MPRESSION: Findings consistent with active inflammatory bowel disease scattered throughout the colon and rectum. Most severe wall thickening and inflammation noted in the mid and upper ascending colon and throughout the entire transverse colon.  Lab Results  Component Value Date   WBC 7.8 12/02/2023   HGB 11.8 (L) 12/02/2023   HCT 35.0 (L) 12/02/2023   MCV 85.2 12/02/2023   PLT 451.0 (H) 12/02/2023    Lab Results  Component Value Date   NA 137 12/02/2023   CL 102 12/02/2023   K 4.2 12/02/2023   CO2 28 12/02/2023   BUN 8 12/02/2023   CREATININE 0.84 12/02/2023   GFR 99.21 12/02/2023   CALCIUM 8.8 12/02/2023   ALBUMIN 3.7 12/02/2023   GLUCOSE 104 (H) 12/02/2023    Lab Results  Component Value Date   ALT 12 12/02/2023   AST 12 12/02/2023   ALKPHOS 55 12/02/2023   BILITOT 0.4 12/02/2023       Past Medical History:  Diagnosis Date   Allergy    year around sinus issues    Anxiety    C. difficile colitis    Depression    GERD (gastroesophageal reflux disease)    no meds now   Hypertension    Sleep apnea    does not wear a c-pap   Thyroid disease      Past Surgical History:  Procedure Laterality Date   COLONOSCOPY     SIGMOIDOSCOPY     WISDOM TOOTH EXTRACTION     Family History  Problem Relation Age of Onset   Hypertension Mother     Thyroid disease Mother    Hypertension Father    Dementia Father    Colon cancer Neg Hx    Esophageal cancer Neg Hx    Pancreatic cancer Neg Hx    Prostate cancer Neg Hx    Rectal cancer Neg Hx    Stomach cancer Neg Hx    Colon polyps Neg Hx    Social History   Tobacco Use   Smoking status: Former    Current packs/day: 0.00    Types: Cigarettes    Quit date: 11/13/1997    Years since quitting: 26.0   Smokeless tobacco: Never  Vaping Use   Vaping status: Never Used  Substance Use Topics   Alcohol use: No    Comment: none since Jan 2018   Drug use: No   Current Outpatient Medications  Medication Sig Dispense Refill   albuterol  (VENTOLIN  HFA) 108 (90 Base) MCG/ACT inhaler Inhale into the lungs. As needed     cholecalciferol (VITAMIN D) 1000 units tablet Take by mouth.     losartan  (COZAAR ) 100 MG tablet Take 50 mg by mouth daily.     MAGNESIUM PO Take by mouth.     NP THYROID 90 MG tablet Take 90 mg by mouth daily.  2   predniSONE (DELTASONE) 10 MG tablet Take 4 tablets (40 mg total) by mouth daily for 7 days, THEN 3 tablets (30 mg total) daily for 7 days, THEN 2 tablets (20 mg total) daily for 7 days, THEN 1 tablet (10 mg total) daily for 7 days. 70 tablet 0   vitamin k 100 MCG tablet Take 100 mcg by mouth daily.     Current Facility-Administered Medications  Medication Dose Route Frequency Provider Last Rate Last Admin   0.9 %  sodium chloride  infusion  500 mL Intravenous Continuous Parthiv Mucci, Lendon Queen, MD       0.9 %  sodium chloride  infusion  500 mL Intravenous Once Emylee Decelle, Lendon Queen, MD       Allergies  Allergen Reactions   Vancomycin  Other (See Comments)    Couldn't urinate   Ciprofloxacin Swelling    Swelling of lower leg muscles     Review of Systems: All systems reviewed and negative except where noted in HPI.    CT ABDOMEN PELVIS W CONTRAST Result Date: 12/02/2023 CLINICAL DATA:  weight loss, mid abdominal pain, Crohn's EXAM: CT ABDOMEN AND PELVIS  WITH CONTRAST TECHNIQUE: Multidetector CT imaging of the abdomen and pelvis was performed using the standard protocol following bolus administration of intravenous contrast. RADIATION DOSE REDUCTION: This exam was performed according to the departmental dose-optimization program which includes automated exposure control, adjustment of the mA and/or kV according to  patient size and/or use of iterative reconstruction technique. CONTRAST:  100mL ISOVUE -300 IOPAMIDOL  (ISOVUE -300) INJECTION 61% COMPARISON:  March 14, 2017, October 02, 2016 FINDINGS: Lower chest: No focal airspace consolidation or pleural effusion. Hepatobiliary: No mass.No radiopaque stones or wall thickening of the gallbladder. No intrahepatic or extrahepatic biliary ductal dilation. The portal veins are patent. Pancreas: No mass or main ductal dilation. No peripancreatic inflammation or fluid collection. Spleen: Normal size. No mass. Adrenals/Urinary Tract: No adrenal masses. No renal mass. No nephrolithiasis or hydronephrosis. The urinary bladder is completely decompressed. Stomach/Bowel: The stomach is decompressed without focal abnormality. No small bowel wall thickening or inflammation. No small bowel obstruction.No extravasation of enteric contrast to suggest bowel perforation.Normal appendix. Moderate wall thickening of the mid and upper ascending colon and the entire transverse colon with pericolonic inflammation. Multifocal wall thickening noted in the distal descending colon with distal sigmoid and rectal wall thickening. Vascular/Lymphatic: No aortic aneurysm. No intraabdominal or pelvic lymphadenopathy. Reproductive: No prostatomegaly.No free pelvic fluid. Other: No pneumoperitoneum, ascites, or mesenteric inflammation. Musculoskeletal: No acute fracture or destructive lesion. Multilevel thoracolumbar osteophytosis. IMPRESSION: Findings consistent with active inflammatory bowel disease scattered throughout the colon and rectum. Most severe  wall thickening and inflammation noted in the mid and upper ascending colon and throughout the entire transverse colon. Electronically Signed   By: Rance Burrows M.D.   On: 12/02/2023 16:03    Physical Exam: BP 126/70   Pulse 93   Ht 6' 1.5" (1.867 m)   Wt 159 lb (72.1 kg)   BMI 20.69 kg/m  Constitutional: Pleasant,well-developed, male in no acute distress. Neurological: Alert and oriented to person place and time. Psychiatric: Normal mood and affect. Behavior is normal.   ASSESSMENT: 53 y.o. male here for assessment of the following  1. Crohn's disease of colon with complication (HCC)   2. High risk medication use    Untreated Crohn's disease for the past several years, as above, has unfortunately been taking care of ill parents and not able to keep his appointments for care.  Had done well for several years with his Crohn's disease however has had severe flare in recent months.  CT scan showing active inflammation in his colon with fecal calprotectin of over 4000.  We discussed options.  He understands his Crohn's disease, risks for complications of the disease and colon cancer.  We discussed several options to treat his Crohn's.  His preference is to avoid infusion based therapies if at all possible.  Given he has been exposed to Remicade  in the past, unclear if this will work as well as it did previously for him.  However given he has been exposed to this he is a candidate for Rinvoq.  He would strongly prefer an oral regimen if that can be approved by his insurance.  We discussed Rinvoq, risks associated with this.  He understands.  I recommend another dose of the Shingrix vaccine and he might need to complete the full series but defer that to the pharmacist/primary care.  He is due for pneumococcal vaccine PCV 21 and we can give him that today.  He will need seasonal flu shot.  Otherwise we will have our staff work on trying to get Rinvoq approved for him, unclear how much or his  co-pay will be your coverage until we do that.  We will see if his insurance covers this or what they would recommend as an alternative for coverage.  Until we hear from them about starting that, we will start  him on prednisone 40 mg daily for 2 weeks and then taper by 10 mg/week until done.  My hope is that we can start him on biologic therapy within the next few weeks once we do that we can taper off prednisone more rapidly.  I will see him back in 3 to 4 months for reassessment, hopefully does better on biologic therapy and can plan for surveillance colonoscopy within the next 6 to 9 months or so.  If he does not respond, has side effects etc. he will contact me  PLAN: - PCV 21 vaccine today - Shingrix vaccine - prescribe next dose and defer if / when he needs another dose to pharmacy / primary care - seasonal flu shot this fall - prednisone taper 40mg  / day for 2 weeks followed by 10mg  / week taper - apply for Rinvoq 45mg  / day for 12 weeks and then transition to RInvoq 30mg /day thereafter - will need to get prior authorization. Repeat CBC, CMET in 6 weeks or so and lipid panel in 12 weeks if starting Rinvoq - follow up 3-4 months  Christi Coward, MD [facility]

## 2023-12-09 NOTE — Patient Instructions (Addendum)
 We have sent the following medications to your pharmacy for you to pick up at your convenience: Prednisone taper as directed   We have given you a prescription for Shingrix vaccine to take to your pharmacy.   Follow-up in 4 months. Office will contact you to schedule.   _______________________________________________________  If your blood pressure at your visit was 140/90 or greater, please contact your primary care physician to follow up on this.  _______________________________________________________  If you are age 54 or older, your body mass index should be between 23-30. Your Body mass index is 20.69 kg/m. If this is out of the aforementioned range listed, please consider follow up with your Primary Care Provider.  If you are age 62 or younger, your body mass index should be between 19-25. Your Body mass index is 20.69 kg/m. If this is out of the aformentioned range listed, please consider follow up with your Primary Care Provider.   ________________________________________________________  The [facility] providers would like to encourage you to use MYCHART to communicate with providers for non-urgent requests or questions.  Due to long hold times on the telephone, sending your provider a message by [facility] may be a faster and more efficient way to get a response.  Please allow 48 business hours for a response.  Please remember that this is for non-urgent requests.  _______________________________________________________  Thank you for choosing me and [facility].  Dr. General Kenner

## 2023-12-09 NOTE — Telephone Encounter (Signed)
-----   Message from Ace Holder sent at 12/09/2023  4:43 PM EDT ----- Regarding: Rinvoq authorization I am looking to start this patient on Rinvoq 45 mg/day for 12 weeks and then transition to 30 mg/day thereafter.  Can someone help with prior authorization for this to see if covered by insurance, or what his options would be if co-pay is too high or if this is not approved?  Thanks!  Dr. Alana Hoyle

## 2023-12-10 ENCOUNTER — Telehealth: Payer: Self-pay

## 2023-12-10 ENCOUNTER — Other Ambulatory Visit (HOSPITAL_COMMUNITY): Payer: Self-pay

## 2023-12-10 NOTE — Telephone Encounter (Signed)
 PA request has been Started. New Encounter has been or will be created for follow up. For additional info see Pharmacy Prior Auth telephone encounter from 12/10/2023.

## 2023-12-10 NOTE — Telephone Encounter (Signed)
 Pharmacy Patient Advocate Encounter   Per test claim: PA required; PA submitted to above mentioned insurance via CoverMyMeds Key/confirmation #/EOC [id number] Status is pending

## 2023-12-10 NOTE — Telephone Encounter (Signed)
 See note below. I only see where the pt was previously on remicade .

## 2023-12-10 NOTE — Telephone Encounter (Signed)
 Pharmacy Patient Advocate Encounter   Received notification from Pt Calls Messages that prior authorization for Rinvoq 45MG  er tablets is required/requested.   Insurance verification completed.   The patient is insured through [facility] .   Per test claim: Prior Authorization form/request asks a question that requires your assistance. Please see the question below and advise accordingly. The PA will not be submitted until the necessary information is received.

## 2023-12-10 NOTE — Telephone Encounter (Signed)
 Yes he was previously on Remicade  and does not wish to go back on it. HE has had prior anti-TNF exposure and should be considered for Rinvoq. Thanks

## 2023-12-13 LAB — CALPROTECTIN: Calprotectin: 3250 ug/g — ABNORMAL HIGH

## 2023-12-13 LAB — HEPATITIS B CORE ANTIBODY, TOTAL: Hep B Core Total Ab: NONREACTIVE

## 2023-12-15 ENCOUNTER — Other Ambulatory Visit: Payer: Self-pay

## 2023-12-15 ENCOUNTER — Other Ambulatory Visit: Payer: Self-pay | Admitting: Gastroenterology

## 2023-12-15 ENCOUNTER — Other Ambulatory Visit (HOSPITAL_COMMUNITY): Payer: Self-pay

## 2023-12-15 MED ORDER — RINVOQ 45 MG PO TB24: 45.0000 mg | ORAL_TABLET | Freq: Every day | ORAL | 2 refills | Status: DC

## 2023-12-15 NOTE — Telephone Encounter (Signed)
 When I try to submit for the 30mg  it shows that there is a PA already in place, so I'm going to assume so. It states that patient must fill at an outside specialty pharmacy so I'm unable to process to confirm.

## 2023-12-15 NOTE — Telephone Encounter (Signed)
 Pharmacy Patient Advocate Encounter  Received notification from [facility] that Prior Authorization for Rinvoq 45MG  er tablets has been APPROVED from 12-14-2023 to 06-10-2024   PA #/Case ID/Reference #: [id number]

## 2023-12-15 NOTE — Telephone Encounter (Signed)
 Script sent in for rinvoq 45mg  daily for 12 weeks to [facility]. Pt notified via mychart.

## 2023-12-15 NOTE — Telephone Encounter (Signed)
 Has the 30mg  maintenance dose also been approved?

## 2023-12-16 ENCOUNTER — Other Ambulatory Visit: Payer: Self-pay

## 2023-12-16 NOTE — Telephone Encounter (Signed)
 Only one was sent in, just cancelled it.

## 2023-12-20 ENCOUNTER — Encounter: Payer: Self-pay | Admitting: Gastroenterology

## 2023-12-22 ENCOUNTER — Other Ambulatory Visit: Payer: Self-pay

## 2023-12-22 ENCOUNTER — Other Ambulatory Visit (HOSPITAL_COMMUNITY): Payer: Self-pay

## 2023-12-22 MED ORDER — RINVOQ 45 MG PO TB24
45.0000 mg | ORAL_TABLET | Freq: Every day | ORAL | 2 refills | Status: DC
Start: 2023-12-22 — End: 2023-12-28

## 2023-12-22 MED ORDER — RINVOQ 45 MG PO TB24: 45.0000 mg | ORAL_TABLET | Freq: Every day | ORAL | 2 refills | Status: DC

## 2023-12-22 NOTE — Telephone Encounter (Signed)
 Insurance requires patient fill at either [facility] or [facility]

## 2023-12-23 ENCOUNTER — Other Ambulatory Visit (HOSPITAL_COMMUNITY): Payer: Self-pay

## 2023-12-23 NOTE — Telephone Encounter (Signed)
 I can't confirm that this is his co-pay since I'm unable to run a test claim to check, but I can see about getting him additional assistance.

## 2023-12-24 MED ORDER — PREDNISONE 10 MG PO TABS: ORAL_TABLET | ORAL | 0 refills | Status: AC

## 2023-12-28 ENCOUNTER — Other Ambulatory Visit: Payer: Self-pay

## 2023-12-28 MED ORDER — RINVOQ 45 MG PO TB24: 45.0000 mg | ORAL_TABLET | Freq: Every day | ORAL | 2 refills | Status: DC

## 2024-01-04 ENCOUNTER — Other Ambulatory Visit: Payer: Self-pay

## 2024-01-04 MED ORDER — RINVOQ 30 MG PO TB24: ORAL_TABLET | ORAL | 3 refills | Status: DC

## 2024-01-21 ENCOUNTER — Ambulatory Visit: Admitting: Nurse Practitioner

## 2024-02-24 ENCOUNTER — Telehealth: Payer: Self-pay | Admitting: Gastroenterology

## 2024-02-24 NOTE — Telephone Encounter (Signed)
 Inbound call from patient stating he is need a replacement bottle of Rinvoq  due to having to stop medication after first dose. States he does not want to request a refill through pharmacy due to it being considered his second bottle. Patient requesting a call to discuss futher. Please advise, thank you.

## 2024-02-25 NOTE — Telephone Encounter (Signed)
 Patient states that he was on Rinvoq  45 mg for about 2 weeks in June then had to stop due to an infected tooth. States he needs to restart the medication but was told by the Rinvoq  rep that he should discard the remainder of first bottle since it is very temperature sensitive. Therefore, sounds like he just needs ok for us  to restart him on 45 mg x 12 weeks send an additional refill to the pharmacy for the 1 month of 45 mg tablets discarded. Is this okay with you?

## 2024-02-27 NOTE — Telephone Encounter (Signed)
Yes that is okay.  Thanks.

## 2024-02-28 MED ORDER — RINVOQ 45 MG PO TB24: 45.0000 mg | ORAL_TABLET | Freq: Every day | ORAL | 0 refills | Status: DC

## 2024-02-28 NOTE — Telephone Encounter (Signed)
 Rx sent to [facility] as requested by patient.

## 2024-03-08 ENCOUNTER — Telehealth: Payer: Self-pay | Admitting: Gastroenterology

## 2024-03-08 NOTE — Telephone Encounter (Signed)
 Inbound call from Amy with [facility] requesting to speak with nurse regarding patients Rinvoq . Call back number [phone]. Please advise.

## 2024-03-08 NOTE — Telephone Encounter (Signed)
 Spoke to [employer], pharmacist at [facility] regarding patient's Rinvoq  45 mg. We sent 1 additional refill of Rinvoq  45 mg dosing to [facility] on 02/23/24 as per patient request since he was unable to use the 1st bottle previously prescribed (took 5 doses, had to d/c for dental issues, then could not use left over medication in bottle due to temp instability of the med). Marium asked if she could just start a whole new prescription and get rid of old rx for 45 mcg 2 bottles. I advised she may provide rx for Rinvoq  45 mcg 1 tablet daily #28 with 2 refills. She also states that she has Rinvoq  30 mg on file for patient's maintenance dosing.

## 2024-05-04 ENCOUNTER — Telehealth: Payer: Self-pay | Admitting: Gastroenterology

## 2024-05-04 NOTE — Telephone Encounter (Signed)
 Inbound call from patient stating that he is needing a refill on the 3 rd bottle of Rinvoq  45 MG due to there being a time lapse. Patient is requesting a call back to inform him that the medication has been sent to the speciality pharmacy. Please advise.

## 2024-05-05 MED ORDER — RINVOQ 45 MG PO TB24: 45.0000 mg | ORAL_TABLET | Freq: Every day | ORAL | 0 refills | Status: DC

## 2024-05-05 NOTE — Telephone Encounter (Signed)
Patient returning call. Please advise, thank you

## 2024-05-05 NOTE — Telephone Encounter (Signed)
 Patient states that [facility] has cancelled his rinvoq  script. He called and was told another prior authorization would need to be sent in.  I called and spoke to Haystack with PA team at [facility]. Explained previous situation regarding patient holding Rinvoq  due to dental infection then being told to discard remainder of used bottle due to storage temperature discrepancy by manufacturer. A PA override for Rinvoq  45 mg, one additional bottle (#28) was approved ([id number]). I also sent a new rx for Rinvoq  45 mg one tablet daily x 28 days to [facility]. Patient has been advised of this information.

## 2024-05-05 NOTE — Telephone Encounter (Signed)
 Left message for patient to call back

## 2024-05-10 NOTE — Telephone Encounter (Signed)
 Inbound call from [employer] following up on patient prior auth for patient rinvoq . Hayden Bennett with [facility] is requesting a call back at [phone]. Please advise.

## 2024-05-10 NOTE — Telephone Encounter (Signed)
 I spoke to [employer], Wyvonna at [phone] who says that insurance is requiring an authorization of override directly through the company. States she can see where [facility] does have a PA but insurance still requires override on their part. Wyvonna went through great lengths in resolving this issue; was on the phone over an hour with her and insurance company. Wyvonna states she was able to get an override with insurance and prescription is now ready to be shipped.  I have spoken to Mr.Asare to advise of this information as well and have asked him to let us  know that he is able to get the medication.

## 2024-05-11 NOTE — Telephone Encounter (Signed)
 PT calling to update us  that the Rinvoq  was delivered to him. If there are any further concerns, he does ask that someone reach out to him.

## 2024-06-02 ENCOUNTER — Other Ambulatory Visit: Payer: Self-pay | Admitting: Gastroenterology

## 2024-06-05 ENCOUNTER — Telehealth: Payer: Self-pay | Admitting: Gastroenterology

## 2024-06-05 ENCOUNTER — Telehealth: Payer: Self-pay

## 2024-06-05 ENCOUNTER — Other Ambulatory Visit (HOSPITAL_COMMUNITY): Payer: Self-pay

## 2024-06-05 DIAGNOSIS — K50119 Crohn's disease of large intestine with unspecified complications: Secondary | ICD-10-CM

## 2024-06-05 DIAGNOSIS — Z79899 Other long term (current) drug therapy: Secondary | ICD-10-CM

## 2024-06-05 NOTE — Telephone Encounter (Signed)
 Currently awaiting Dr Hassan response to insurance questions about rinvoq  for insurance to authorize.

## 2024-06-05 NOTE — Telephone Encounter (Signed)
 Pharmacy Patient Advocate Encounter   Received notification from Pt Calls Messages that prior authorization for Rinvoq  30MG  er tablets is required/requested.   Insurance verification completed.   The patient is insured through [facility]   Prior Authorization form/request asks a question that requires your assistance. Please see the question below and advise accordingly. The PA will not be submitted until the necessary information is received.

## 2024-06-05 NOTE — Telephone Encounter (Signed)
 Inbound call from patient stating he's trying to start his 30 mg of Rinvoq  but pharmacy Is stating they are needing providers authorization for medication to be sent to patient. [facility]  Please advise  Thank you

## 2024-06-06 NOTE — Telephone Encounter (Signed)
 I have spoken to patient who is on his final bottle of Rinvoq  45 mg prior to moving towards maintenance dosing. He states that he is doing very well on the Rinvoq  and has had less diarrhea and abdominal pain. Patient is asked to come for labs this week or next at [facility] facility which he agrees to. Orders placed in EPIC. Patient has scheduled a follow up with Dr Leigh on 07/25/24 as well.

## 2024-06-06 NOTE — Telephone Encounter (Signed)
 Dottie can you help touch base with the patient to clarify a few things: - can you clarify with the patient if he has been taking the Rinvoq  so far - for approval of his Rinvoq  we need to know if it has helped him - has it improved his diarrhea? - he is due for basic labs - CBC, CMET, CRP, fecal calprotectin if you can have him go to the lab - he is due for office visit with me for Crohn's in a few months if you can help coordinate. Thanks

## 2024-06-06 NOTE — Telephone Encounter (Signed)
 Pharmacy Patient Advocate Encounter  Received notification from [facility] that Prior Authorization for Rinvoq  30MG  er tablets has been APPROVED from 06-06-2024 to 06-06-2025   PA #/Case ID/Reference #: [id number]

## 2024-06-06 NOTE — Telephone Encounter (Signed)
 Pharmacy Patient Advocate Encounter    Per test claim: PA required; PA submitted to above mentioned insurance via Latent Key/confirmation #/EOC [id number] Status is pending

## 2024-07-19 ENCOUNTER — Telehealth: Payer: Self-pay | Admitting: Gastroenterology

## 2024-07-19 NOTE — Telephone Encounter (Signed)
 [employer] is calling to have a PA for Rinvoq . Please contact them at [phone] for any further concerns.

## 2024-07-20 ENCOUNTER — Other Ambulatory Visit (HOSPITAL_COMMUNITY): Payer: Self-pay

## 2024-07-20 ENCOUNTER — Telehealth: Payer: Self-pay

## 2024-07-20 MED ORDER — RINVOQ 30 MG PO TB24: ORAL_TABLET | ORAL | 3 refills | Status: AC

## 2024-07-20 NOTE — Telephone Encounter (Signed)
 Pharmacy Patient Advocate Encounter   Received notification from Pt Calls Messages that prior authorization for Rinvoq  30MG  er tablets is required/requested.   Insurance verification completed.   The patient is insured through [employer].   Per test claim: Prior authorization not required at this time. Drug must be filled at preferred or specialty pharmacy.   Cannot fill at [facility]. Does not state where.

## 2024-07-20 NOTE — Telephone Encounter (Signed)
 I called patient to advise that per PA team, no prior authorization is needed for Rinvoq  30 mg. Advised that he must use insurance preferred specialty pharmacy though test claim does not tell us  which pharmacy that is.   Patient states that insurance changed this year and he now must use [facility], phone [phone].  I have sent prescription of Rinvoq  30 mg to [facility] electronically.  Patient is asked to reach out if he has any additional issues with getting the medication.

## 2024-07-20 NOTE — Telephone Encounter (Signed)
 PA request has been Cancelled. New Encounter has been or will be created for follow up. For additional info see Pharmacy Prior Auth telephone encounter from 07-20-2024.

## 2024-07-20 NOTE — Telephone Encounter (Signed)
 PT is calling to get an update on the medication authorization. Please advise.

## 2024-07-25 ENCOUNTER — Ambulatory Visit: Payer: Self-pay | Admitting: Gastroenterology

## 2024-09-26 ENCOUNTER — Ambulatory Visit: Payer: Self-pay | Admitting: Gastroenterology
